# Patient Record
Sex: Male | Born: 1965 | Hispanic: Yes | Marital: Married | Smoking: Never smoker
Health system: Southern US, Community
[De-identification: ages and names within clinical notes are randomized; demographics above are authoritative.]

## PROBLEM LIST (undated history)

## (undated) DIAGNOSIS — R7303 Prediabetes: Secondary | ICD-10-CM

## (undated) DIAGNOSIS — G4733 Obstructive sleep apnea (adult) (pediatric): Secondary | ICD-10-CM

## (undated) DIAGNOSIS — K219 Gastro-esophageal reflux disease without esophagitis: Secondary | ICD-10-CM

## (undated) DIAGNOSIS — E785 Hyperlipidemia, unspecified: Secondary | ICD-10-CM

## (undated) DIAGNOSIS — R011 Cardiac murmur, unspecified: Secondary | ICD-10-CM

## (undated) DIAGNOSIS — M5030 Other cervical disc degeneration, unspecified cervical region: Secondary | ICD-10-CM

## (undated) DIAGNOSIS — I10 Essential (primary) hypertension: Secondary | ICD-10-CM

## (undated) DIAGNOSIS — E559 Vitamin D deficiency, unspecified: Secondary | ICD-10-CM

## (undated) DIAGNOSIS — R519 Headache, unspecified: Secondary | ICD-10-CM

## (undated) DIAGNOSIS — K429 Umbilical hernia without obstruction or gangrene: Secondary | ICD-10-CM

## (undated) DIAGNOSIS — F419 Anxiety disorder, unspecified: Secondary | ICD-10-CM

## (undated) DIAGNOSIS — Z9989 Dependence on other enabling machines and devices: Secondary | ICD-10-CM

## (undated) HISTORY — DX: Prediabetes: R73.03

## (undated) HISTORY — DX: Essential (primary) hypertension: I10

## (undated) HISTORY — DX: Vitamin D deficiency, unspecified: E55.9

## (undated) HISTORY — DX: Dependence on other enabling machines and devices: Z99.89

## (undated) HISTORY — DX: Obstructive sleep apnea (adult) (pediatric): G47.33

## (undated) HISTORY — DX: Hyperlipidemia, unspecified: E78.5

## (undated) HISTORY — PX: NO PAST SURGERIES: SHX2092

---

## 2013-12-28 LAB — COMPREHENSIVE METABOLIC PANEL
ALK PHOS: 51 U/L
ALT: 37
AST: 30 U/L
BILIRUBIN: 1.2
Creat: 1.12
Glucose: 65
LDH: 213 U/L
POTASSIUM: 3.6 mmol/L
Sodium: 140 mmol/L (ref 137–147)

## 2013-12-28 LAB — TSH: TSH: 1.76

## 2013-12-28 LAB — LIPID PANEL
CHOLESTEROL, TOTAL: 236
HDL: 46 mg/dL (ref 35–70)
LDL CALC: 162
Triglycerides: 142

## 2013-12-28 LAB — VITAMIN D 25 HYDROXY (VIT D DEFICIENCY, FRACTURES): Vit D, 25-Hydroxy: 20.6

## 2013-12-28 LAB — PSA: PSA: 0.8

## 2013-12-28 LAB — HEMOGLOBIN A1C: A1c: 6

## 2013-12-28 LAB — CBC
HGB: 16.6 g/dL
WBC: 7.1
platelet count: 14

## 2014-10-12 ENCOUNTER — Ambulatory Visit (INDEPENDENT_AMBULATORY_CARE_PROVIDER_SITE_OTHER): Payer: BLUE CROSS/BLUE SHIELD | Admitting: Family Medicine

## 2014-10-12 ENCOUNTER — Encounter: Payer: Self-pay | Admitting: Family Medicine

## 2014-10-12 VITALS — BP 154/90 | HR 82 | Temp 98.3°F | Ht 71.25 in | Wt 223.2 lb

## 2014-10-12 DIAGNOSIS — R7309 Other abnormal glucose: Secondary | ICD-10-CM

## 2014-10-12 DIAGNOSIS — Z9989 Dependence on other enabling machines and devices: Secondary | ICD-10-CM

## 2014-10-12 DIAGNOSIS — E559 Vitamin D deficiency, unspecified: Secondary | ICD-10-CM

## 2014-10-12 DIAGNOSIS — Z6830 Body mass index (BMI) 30.0-30.9, adult: Secondary | ICD-10-CM | POA: Insufficient documentation

## 2014-10-12 DIAGNOSIS — R030 Elevated blood-pressure reading, without diagnosis of hypertension: Secondary | ICD-10-CM

## 2014-10-12 DIAGNOSIS — IMO0001 Reserved for inherently not codable concepts without codable children: Secondary | ICD-10-CM | POA: Insufficient documentation

## 2014-10-12 DIAGNOSIS — R7303 Prediabetes: Secondary | ICD-10-CM | POA: Insufficient documentation

## 2014-10-12 DIAGNOSIS — I10 Essential (primary) hypertension: Secondary | ICD-10-CM

## 2014-10-12 DIAGNOSIS — G4733 Obstructive sleep apnea (adult) (pediatric): Secondary | ICD-10-CM

## 2014-10-12 DIAGNOSIS — E785 Hyperlipidemia, unspecified: Secondary | ICD-10-CM

## 2014-10-12 DIAGNOSIS — E669 Obesity, unspecified: Secondary | ICD-10-CM

## 2014-10-12 DIAGNOSIS — E663 Overweight: Secondary | ICD-10-CM | POA: Insufficient documentation

## 2014-10-12 NOTE — Assessment & Plan Note (Signed)
Continue 2000 IU daily.  

## 2014-10-12 NOTE — Assessment & Plan Note (Signed)
Elevated previously - provided with low chol diet handout. Will recheck in next few months.

## 2014-10-12 NOTE — Assessment & Plan Note (Signed)
Reports compliance with CPAP. Thinks has setting of 4.

## 2014-10-12 NOTE — Assessment & Plan Note (Signed)
Planning on returning to apartment gym.

## 2014-10-12 NOTE — Assessment & Plan Note (Signed)
Prior controlled with healthy diet and lifestyle. Will monitor at home and notify me if persistently elevated >140/90

## 2014-10-12 NOTE — Patient Instructions (Addendum)
Return June for CPE. Regresar en junio despues de laboratorios en el trabajo para examen fisico. Si presion consistentemente 140/90, dejeme saber para empezar medicina para presion arterial.  Empieza a visitar gimnasio de nuevo.  Revisa en trabajo la fecha de la ultima vacuna contra tetano y tos ferina (Td o Tdap). Tambien revisa sobre hepatitis B.

## 2014-10-12 NOTE — Assessment & Plan Note (Signed)
Reviewed #s with patient. Encouraged avoiding added sugars. 

## 2014-10-12 NOTE — Progress Notes (Signed)
Pre visit review using our clinic review tool, if applicable. No additional management support is needed unless otherwise documented below in the visit note. 

## 2014-10-12 NOTE — Progress Notes (Signed)
BP 154/90 mmHg  Pulse 82  Temp(Src) 98.3 F (36.8 C) (Oral)  Ht 5' 11.25" (1.81 m)  Wt 223 lb 4 oz (101.266 kg)  BMI 30.91 kg/m2   CC: new pt to establish  Subjective:    Patient ID: Jesse Phillips, male    DOB: [birth year], 49 y.o.   MRN: [id number]  HPI: Jesse Phillips is a 49 y.o. male presenting on 10/12/2014 for Establish Care   Planning 25 yr anniversary trip to [place]. Lives with wife Riki AltesLidia.  HTN - bp elevated at work as well. Changed diet, increased exercise, and bp improved. Now with weight gain and decreased activity level over last year, bp slowly increasing- but at work still well controlled (120/80s). Drinks vegetables smoothie every morning.   Prediabetes - on latest labwork 12/2013.   HLD - on latest labwork. Never on meds in past.   OSA on CPAP - reports compliance with this.  Intermittent groin pain that alternates sides.   Preventative: Prostate cancer screening: + fmhx - recommend yearly Flu shot - declines Tetanus - will check with work  [place]ives with [place] wife and 1 son Occupation: Location managermachine operator Edu: HS Activity: occasionally gym Diet: likes sweets and carbs, wife cooks healthy  Relevant past medical, surgical, family and social history reviewed and updated as indicated. Interim medical history since our last visit reviewed. Allergies and medications reviewed and updated. No current outpatient prescriptions on file prior to visit.   No current facility-administered medications on file prior to visit.    Review of Systems Per HPI unless specifically indicated above     Objective:    BP 154/90 mmHg  Pulse 82  Temp(Src) 98.3 F (36.8 C) (Oral)  Ht 5' 11.25" (1.81 m)  Wt 223 lb 4 oz (101.266 kg)  BMI 30.91 kg/m2  Wt Readings from Last 3 Encounters:  10/12/14 223 lb 4 oz (101.266 kg)    Physical Exam  Constitutional: He appears well-developed and well-nourished. No distress.  HENT:  Mouth/Throat: Oropharynx is clear and moist. No  oropharyngeal exudate.  Eyes: Conjunctivae and EOM are normal. Pupils are equal, round, and reactive to light. No scleral icterus.  Neck: Normal range of motion. Neck supple. No thyromegaly present.  Cardiovascular: Normal rate, regular rhythm, normal heart sounds and intact distal pulses.   No murmur heard. Pulmonary/Chest: Effort normal and breath sounds normal. No respiratory distress. He has no wheezes. He has no rales.  Musculoskeletal: He exhibits no edema.  Lymphadenopathy:    He has no cervical adenopathy.  Skin: Skin is warm and dry. No rash noted.  Psychiatric: He has a normal mood and affect.  Nursing note and vitals reviewed.  No results found for this or any previous visit.    Assessment & Plan:   Problem List Items Addressed This Visit    Vitamin D deficiency    Continue 2000 IU daily.      Prediabetes    Reviewed #s with patient. Encouraged avoiding added sugars.      OSA on CPAP    Reports compliance with CPAP. Thinks has setting of 4.      Obesity    Planning on returning to apartment gym.      HTN (hypertension) - Primary    Prior controlled with healthy diet and lifestyle. Will monitor at home and notify me if persistently elevated >140/90      HLD (hyperlipidemia)    Elevated previously - provided with low chol diet handout. Will recheck in  next few months.           Follow up plan: Return in about 4 months (around 02/10/2015), or as needed, for annual exam, prior fasting for blood work.

## 2014-10-13 ENCOUNTER — Telehealth: Payer: Self-pay | Admitting: Family Medicine

## 2014-10-13 NOTE — Telephone Encounter (Signed)
Opened in error

## 2014-10-25 ENCOUNTER — Encounter: Payer: Self-pay | Admitting: *Deleted

## 2015-01-03 LAB — IRON: IRON: 102

## 2015-01-03 LAB — COMPREHENSIVE METABOLIC PANEL
ALK PHOS: 55 U/L
ALT: 32
AST: 29 U/L
BILIRUBIN TOTAL: 0.8 mg/dL
BUN: 14 mg/dL (ref 4–21)
Creat: 1.08
GLUCOSE: 70
POTASSIUM: 3.9 mmol/L
Sodium: 140
Uric Acid: 6.2

## 2015-01-03 LAB — LIPID PANEL
Cholesterol: 221
HDL: 52 mg/dL (ref 35–70)
LDL CALC: 148
Triglycerides: 107

## 2015-01-03 LAB — CBC
HEMOGLOBIN: 16 g/dL
PLATELETS: 234
WBC: 5.6

## 2015-01-03 LAB — VITAMIN D 25 HYDROXY (VIT D DEFICIENCY, FRACTURES): VIT D 25 HYDROXY: 33.1

## 2015-01-03 LAB — HEMOGLOBIN A1C: A1C: 5.8

## 2015-01-03 LAB — TSH: TSH: 2.52 u[IU]/mL (ref 0.41–5.90)

## 2015-01-03 LAB — PSA: PSA: 0.8

## 2015-02-21 ENCOUNTER — Encounter: Payer: BLUE CROSS/BLUE SHIELD | Admitting: Family Medicine

## 2015-04-11 ENCOUNTER — Encounter: Payer: Self-pay | Admitting: Family Medicine

## 2015-04-11 ENCOUNTER — Encounter (INDEPENDENT_AMBULATORY_CARE_PROVIDER_SITE_OTHER): Payer: Self-pay

## 2015-04-11 ENCOUNTER — Encounter: Payer: Self-pay | Admitting: *Deleted

## 2015-04-11 ENCOUNTER — Ambulatory Visit (INDEPENDENT_AMBULATORY_CARE_PROVIDER_SITE_OTHER): Payer: BLUE CROSS/BLUE SHIELD | Admitting: Family Medicine

## 2015-04-11 VITALS — BP 124/88 | HR 80 | Temp 98.1°F | Ht 71.25 in | Wt 208.0 lb

## 2015-04-11 DIAGNOSIS — Z1211 Encounter for screening for malignant neoplasm of colon: Secondary | ICD-10-CM

## 2015-04-11 DIAGNOSIS — R7303 Prediabetes: Secondary | ICD-10-CM

## 2015-04-11 DIAGNOSIS — E663 Overweight: Secondary | ICD-10-CM

## 2015-04-11 DIAGNOSIS — E785 Hyperlipidemia, unspecified: Secondary | ICD-10-CM

## 2015-04-11 DIAGNOSIS — Z0000 Encounter for general adult medical examination without abnormal findings: Secondary | ICD-10-CM | POA: Diagnosis not present

## 2015-04-11 DIAGNOSIS — R030 Elevated blood-pressure reading, without diagnosis of hypertension: Secondary | ICD-10-CM

## 2015-04-11 DIAGNOSIS — IMO0001 Reserved for inherently not codable concepts without codable children: Secondary | ICD-10-CM

## 2015-04-11 NOTE — Assessment & Plan Note (Signed)
Discussed #s with patient. Encouraged avoiding added sugars, congratulated on healthy diet and lifestyle changes.

## 2015-04-11 NOTE — Assessment & Plan Note (Signed)
Previously - now back to normal with weight loss and healthy changes. Brings log showing normal readings at work. Will remove from problem list.

## 2015-04-11 NOTE — Progress Notes (Signed)
Pre visit review using our clinic review tool, if applicable. No additional management support is needed unless otherwise documented below in the visit note. 

## 2015-04-11 NOTE — Assessment & Plan Note (Addendum)
Weight loss noted. Congratulated

## 2015-04-11 NOTE — Assessment & Plan Note (Signed)
Preventative protocols reviewed and updated unless pt declined. Discussed healthy diet and lifestyle.  

## 2015-04-11 NOTE — Patient Instructions (Addendum)
Pass by lab to pick up stool kit. Examen de heces para revisar cancer de colon - recogalo en el laboratorio hoy. Gusto verlo hoy, llamenos con preguntas. Regresar en 1 ao para proximo examen fisico.  Mantenimiento de Technical sales engineer (Health Maintenance) Un estilo de vida saludable y los cuidados preventivos pueden favorecer la salud y [place].  No deje de [employer] de rutina de la salud, dentales y de Public librarian.  Consuma una dieta saludable. Los [employer], frutas, cereales integrales, productos lcteos descremados y protenas magras contienen los nutrientes que usted necesita y no tienen muchas caloras. Disminuya la ingesta de alimentos ricos en grasas slidas, azcares y sal agregadas. Si es necesario, pdale informacin acerca de una dieta [place] a su mdico.  Realizar actividad fsica de forma regular es una de las prcticas ms importantes que puede hacer por su salud. Los adultos deben hacer al menos 150 minutos de ejercicios de intensidad moderada (cualquier actividad que aumente la frecuencia cardaca y lo haga transpirar) cada semana. Adems, la [employer] de los adultos necesita practicar ejercicios de fortalecimiento muscular dos o ms veces por semana.  Mantenga un peso saludable. El ndice de masa corporal [facility]) es una herramienta que identifica posibles problemas con [place]. Proporciona una estimacin de la grasa corporal basndose en el peso y la altura. El mdico podr determinar su [facility] y ayudarlo a Scientist, forensic o Theatre manager un peso saludable. Para los adultos mayores de 20aos:  Un [facility] menor de 18,5 se considera bajo peso.  Un [facility] entre 18,5 y 24,9 es normal.  Un [facility] entre 25 y 29,9 se considera sobrepeso.  Un IMC de 30 o ms se considera obesidad.  Mantenga un nivel normal de lpidos y colesterol en la sangre practicando actividad fsica y minimizando la ingesta de grasas saturadas. Consuma una dieta balanceada e incluya variedad de frutas y vegetales. A  partir de los 20 aos se deben realizar anlisis de sangre a fin de Freight forwarder nivel de lpidos y colesterol en la sangre y [place] cada 5 aos. Si los niveles de colesterol son altos, tiene ms de 50 aos o tiene riesgo elevado de sufrir enfermedades cardacas, Designer, industrial/product controlarse con ms frecuencia. Si tiene [employer] de lpidos y colesterol, debe recibir tratamiento con medicamentos, si la dieta y el ejercicio no estn funcionando.  Si fuma, consulte con el mdico acerca de las opciones para dejar de [place]. Si no fuma, no comience.  Se recomienda realizar exmenes de deteccin de cncer de pulmn a personas adultas entre 24 y 72 aos que estn en riesgo de Horticulturist, commercial de pulmn por sus antecedentes de consumo de tabaco. Para quienes hayan fumado durante 30 aos un paquete diario, y sigan fumando o hayan dejado el hbito en algn momento en los ltimos 15 aos, se recomienda realizarse una tomografa computada de baja dosis de los pulmones todos los [place]. Fumar un paquete por ao equivale a fumar un promedio de un paquete de cigarrillos diario durante un ao (por ejemplo, fumar 30paquetes por ao podra significar fumar un paquete de cigarrillos diario durante 30aos o 2paquetes diarios durante 15aos). Los exmenes anuales deben continuar hasta que el fumador haya dejado de fumar durante un mnimo de 15 aos. Ya no deben Emergency planning/management officer que tengan un problema de salud que les impida recibir tratamiento para el cncer de pulmn.  Si decide tomar alcohol, no beba ms de [employer]. Se considera una medida 12onzas (326m) de cerveza, 5onzas (  140m) de vino o [id number](497LG de licor.  Evite el consumo de drogas. No comparta agujas. Pida ayuda si necesita asistencia o instrucciones con respecto a abandonar el consumo de drogas.  La hipertensin arterial causa enfermedades cardacas y aSerbiael riesgo de ictus. Debe controlar su presin arterial al menos  cada uno o d[place] La presin arterial elevada que persiste debe tratarse con medicamentos si la prdida de peso y el ejercicio no son efectivos.  Si tiene entre 456y 743aos, consulte a su mdico si debe tomar aspirina para prevenir enfermedades cardacas.  Los anlisis para la diabetes incluyen la toma de uTanzaniade sangre para controlar el nivel de azcar en la sangre durante el [place] Debe hacerlos cada 3aos despus de los 441aossi su peso es normal y no tiene factores de riesgo de diabetes. Las pruebas deben comenzar a edades tempranas o llevarse a cabo con ms frecuencia si tiene sobrepeso y al menos un factor de riesgo para la diabetes.  El cncer colorrectal puede detectarse y con frecuencia puede prevenirse. La mayor parte de los estudios de rutina se deben cMedical laboratory scientific officera hField seismologista pProofreaderde los 566aos y hBrown Station781aos. Sin embargo, el mdico podr aconsejarle que lo haga antes, si tiene factores de riesgo para el cncer de colon. Una vez por ao, el mdico le dar un kit de prueba para hHydrologisten la materia fecal. Es posible que se use una pequea cmara en el extremo de un tubo para examinar directamente el colon (sigmoidoscopa o colonoscopa) para dHydrographic surveyorformas tempranas de cncer colorrectal. Hable sobre esto con su mdico si tiene 570aos, edad a la que cWhole Foodsestudios de rNepal El examen directo del colon debe repetirse cada cinco a 10 aos, hasta los 75 aos, excepto que se encuentren formas tempranas de plipos precancerosos o pequeos bultos.  Las personas con un riesgo mayor de pInsurance risk surveyorhepatitis B deben realizarse anlisis para dFutures tradervirus. Se considera que tiene un alto riesgo de cMuseum/gallery curatorhepatitis B si:  Naci en un pas donde la hepatitis B es frecuente. Pregntele a su mdico qu pases son considerados de aPublic affairs consultant  Sus padres nacieron en un pas de alto riesgo y usted no recibi una vacuna que lo proteja contra la hepatitis B (vacuna  contra la hepatitis B).  TSouth [place]  UCanadaagujas para inyectarse drogas.  Vive o tiene sexo con alguien que tiene hepatitis B.  Es un hombre que tiene sexo con otros hombres.  Recibe tratamiento de hemodilisis.  Toma ciertos medicamentos para eNurse, mental health trasplante de rganos y afecciones autoinmunes.  Se recomienda realizar un anlisis de sangre para dHydrographic surveyorhepatitis C a todas las personas nacidas entre 1945 y 1965, y a toda persona que tenga un riesgo de haber contrado esta enfermedad.  Los hombres sanos no deben hacerse anlisis de sangre para dHydrographic surveyorantgenos especficos prostticos (PSA) como parte de los estudios de rutina para eScience writer Pregntele a su mdico sobre las pruebas de deteccin de cncer de prstata.  La evaluacin del cncer de testculos no se recomienda en hombres adolescentes ni adultos que no tengan sntomas. La evaluacin incluye el autoexamen, el examen por parte del mdico y otras pruebas diagnsticas. Consulte con su mdico si tiene algn sntoma o preocupaciones acerca del cncer de testculos.  Practique el sexo seguro. Use condones y evite las prcticas sexuales riesgosas para disminuir el contagio de enfermedades de transmisin sexual (  ETS).  Debe realizarse pruebas de deteccin de ETS, incluidas la gonorrea y la clamidia si:  Es sexualmente activa y es menor de 24aos.  Es mayor de 13aos y Investment banker, operational dice que est en riesgo de padecer esta infeccin.  La actividad sexual ha cambiado desde que le hicieron la ltima prueba de deteccin y tiene un riesgo mayor de Best boy clamidia o Radio broadcast assistant. Pregntele al mdico si usted tiene riesgo.  Si tiene riesgo de infectarse por el VIH, se recomienda tomar diariamente un medicamento recetado para evitar la infeccin. Esto se conoce como profilaxis previa a la exposicin. Se considera que est en riesgo si:  Es un hombre que tiene sexo con otros hombres.  Es heterosexual y es activo  sexualmente con mltiples parejas.  Se inyecta drogas.  Es sexualmente activo con una pareja que tiene VIH.  Consulte a su mdico para saber si tiene un alto riesgo de infectarse por el VIH. Si opta por comenzar la profilaxis previa a la exposicin, primero debe realizarse anlisis de deteccin del VIH. Luego, le harn anlisis cada 64mses mientras est tomando los medicamentos para la profilaxis previa a la exposicin.  Utilice pantalla solar. Aplique pantalla solar de mKerry Doryy repetida a lo largo del dTraining and development officer Resgurdese del sol cuando la sombra sea ms pequea que usted. Protjase usando mangas y pThe ServiceMaster Company un sombrero de ala ancha y gafas para el sol todo el ao, siempre que se encuentre en el exterior.  Informe a su mdico si aparecen nuevos lunares o los que tiene se modifican, especialmente en forma y color. Tambin notifique al mdico si un lunar es ms grande que el tamao de una goma de lGames developer  Si tiene entre 622y 738aos y es o ha sido fumador, se recomienda un estudio con ecografa para dEnvironmental managerde aorta abdominal (AAA) y su eventual reparacin qUnited [place]  MReasnor(inmunizaciones). Document Released: 02/07/2008 Document Revised: 08/16/2013 ELutheran Hospital Of IndianaPatient Information 2015 ELas Ochen[place] This information is not intended to replace advice given to you by your health care provider. Make sure you discuss any questions you have with your health care provider.

## 2015-04-11 NOTE — Progress Notes (Signed)
BP 124/88 mmHg  Pulse 80  Temp(Src) 98.1 F (36.7 C) (Oral)  Ht 5' 11.25" (1.81 m)  Wt 208 lb (94.348 kg)  BMI 28.80 kg/m2   CC: CPE  Subjective:    Patient ID: Jesse Phillips, male    DOB: Jan 18, 1966, 49 y.o.   MRN: [id number]  HPI: Zarin Knupp is a 49 y.o. male presenting on 04/11/2015 for Annual Exam   Feels sad about brother's death but not overwhelming.  OSA on CPAP   Brings log of blood pressures all <140/90.   Brings blood work - TC and LDL (148) elevated otherwise CMP, CBC, vit D normal. A1c 5.8% - prediabetes   Lost 15lbs! Watching diet, exercising at gym. Drinks juice (celery, garlic, spinach, ginger).   Preventative:  Colon cancer screening -  Prostate cancer screening: + fmhx - recommend yearly  Flu shot - declines  Tdap 10/2014   Lives with [place] wife and 1 son  Occupation: Location manager  From [place]  Edu: HS  Activity: occasionally gym  Diet: likes but avoids sweets and carbs, wife cooks healthy   Relevant past medical, surgical, family and social history reviewed and updated as indicated. Interim medical history since our last visit reviewed. Allergies and medications reviewed and updated. Current Outpatient Prescriptions on File Prior to Visit  Medication Sig  . Cholecalciferol (VITAMIN D) 2000 UNITS CAPS Take 1 capsule by mouth daily.  . Multiple Vitamins-Minerals (CENTRUM ADULTS PO) Take 1 tablet by mouth daily.   No current facility-administered medications on file prior to visit.    Review of Systems  Constitutional: Negative for fever, chills, activity change, appetite change, fatigue and unexpected weight change.  HENT: Negative for hearing loss.   Eyes: Negative for visual disturbance.  Respiratory: Negative for cough, chest tightness, shortness of breath and wheezing.   Cardiovascular: Negative for chest pain, palpitations and leg swelling.  Gastrointestinal: Negative for nausea, vomiting, abdominal pain, diarrhea, constipation,  blood in stool and abdominal distention.  Genitourinary: Negative for hematuria and difficulty urinating.  Musculoskeletal: Negative for myalgias, arthralgias and neck pain.  Skin: Negative for rash.  Neurological: Negative for dizziness, seizures, syncope and headaches.  Hematological: Negative for adenopathy. Does not bruise/bleed easily.  Psychiatric/Behavioral: Negative for dysphoric mood. The patient is not nervous/anxious.    Per HPI unless specifically indicated above     Objective:    BP 124/88 mmHg  Pulse 80  Temp(Src) 98.1 F (36.7 C) (Oral)  Ht 5' 11.25" (1.81 m)  Wt 208 lb (94.348 kg)  BMI 28.80 kg/m2  Wt Readings from Last 3 Encounters:  04/11/15 208 lb (94.348 kg)  10/12/14 223 lb 4 oz (101.266 kg)    Physical Exam  Constitutional: He is oriented to person, place, and time. He appears well-developed and well-nourished. No distress.  HENT:  Head: Normocephalic and atraumatic.  Right Ear: Hearing, tympanic membrane, external ear and ear canal normal.  Left Ear: Hearing, tympanic membrane, external ear and ear canal normal.  Nose: Nose normal.  Mouth/Throat: Uvula is midline, oropharynx is clear and moist and mucous membranes are normal. No oropharyngeal exudate, posterior oropharyngeal edema or posterior oropharyngeal erythema.  Eyes: Conjunctivae and EOM are normal. Pupils are equal, round, and reactive to light. No scleral icterus.  Neck: Normal range of motion. Neck supple. No thyromegaly present.  Cardiovascular: Normal rate, regular rhythm, normal heart sounds and intact distal pulses.   No murmur heard. Pulses:      Radial pulses are 2+ on the right  side, and 2+ on the left side.  Pulmonary/Chest: Effort normal and breath sounds normal. No respiratory distress. He has no wheezes. He has no rales.  Abdominal: Soft. Bowel sounds are normal. He exhibits no distension and no mass. There is no tenderness. There is no rebound and no guarding.  Genitourinary: Rectum  normal and prostate normal. Rectal exam shows no external hemorrhoid, no internal hemorrhoid, no fissure, no mass, no tenderness and anal tone normal. Prostate is not enlarged (15gm) and not tender.  Musculoskeletal: Normal range of motion. He exhibits no edema.  Lymphadenopathy:    He has no cervical adenopathy.  Neurological: He is alert and oriented to person, place, and time.  CN grossly intact, station and gait intact  Skin: Skin is warm and dry. No rash noted.  Psychiatric: He has a normal mood and affect. His behavior is normal. Judgment and thought content normal.  Nursing note and vitals reviewed.  Results for orders placed or performed in visit on 10/25/14  Comprehensive metabolic panel  Result Value Ref Range   Glucose 65    Creat 1.12    Sodium 140 137 - 147 mmol/L   Potassium 3.6 mmol/L   Bilirubin 1.2    Alkaline Phosphatase 51 U/L   LDH 213 u/L   AST 30 U/L   ALT 37   Lipid panel  Result Value Ref Range   Cholesterol, Total 236    Triglycerides 142    HDL 46 35 - 70 mg/dL   LDL (calc) 161   TSH  Result Value Ref Range   TSH 1.760   PSA  Result Value Ref Range   PSA 0.8   CBC  Result Value Ref Range   WBC 7.1    HGB 16.6 g/dL   platelet count 09.6   Hemoglobin A1c  Result Value Ref Range   A1c 6.0   Vit D  25 hydroxy (rtn osteoporosis monitoring)  Result Value Ref Range   Vit D, 25-Hydroxy 20.6       Assessment & Plan:   Problem List Items Addressed This Visit    HLD (hyperlipidemia)    Off meds. Slowly improving. Pt making healthy diet and lifestyle changes      Overweight    Weight loss noted. Congratulated      Prediabetes    Discussed #s with patient. Encouraged avoiding added sugars, congratulated on healthy diet and lifestyle changes.      Health maintenance examination - Primary    Preventative protocols reviewed and updated unless pt declined. Discussed healthy diet and lifestyle.       RESOLVED: Elevated blood pressure     Previously - now back to normal with weight loss and healthy changes. Brings log showing normal readings at work. Will remove from problem list.          Follow up plan: Return in about 1 year (around 04/10/2016), or as needed, for annual exam, prior fasting for blood work.

## 2015-04-11 NOTE — Assessment & Plan Note (Signed)
Off meds. Slowly improving. Pt making healthy diet and lifestyle changes

## 2015-04-18 LAB — FECAL OCCULT BLOOD, GUAIAC: FECAL OCCULT BLD: NEGATIVE

## 2015-04-19 ENCOUNTER — Encounter: Payer: Self-pay | Admitting: Family Medicine

## 2015-04-19 ENCOUNTER — Other Ambulatory Visit: Payer: BLUE CROSS/BLUE SHIELD

## 2015-04-19 DIAGNOSIS — Z1211 Encounter for screening for malignant neoplasm of colon: Secondary | ICD-10-CM

## 2015-04-19 LAB — FECAL OCCULT BLOOD, IMMUNOCHEMICAL: Fecal Occult Bld: NEGATIVE

## 2015-04-20 ENCOUNTER — Encounter: Payer: Self-pay | Admitting: *Deleted

## 2015-11-26 DIAGNOSIS — G4733 Obstructive sleep apnea (adult) (pediatric): Secondary | ICD-10-CM | POA: Diagnosis not present

## 2015-11-26 DIAGNOSIS — J301 Allergic rhinitis due to pollen: Secondary | ICD-10-CM | POA: Diagnosis not present

## 2016-01-18 DIAGNOSIS — G4733 Obstructive sleep apnea (adult) (pediatric): Secondary | ICD-10-CM | POA: Diagnosis not present

## 2016-04-21 DIAGNOSIS — G4733 Obstructive sleep apnea (adult) (pediatric): Secondary | ICD-10-CM | POA: Diagnosis not present

## 2016-05-29 DIAGNOSIS — I10 Essential (primary) hypertension: Secondary | ICD-10-CM | POA: Diagnosis not present

## 2016-05-29 DIAGNOSIS — G4733 Obstructive sleep apnea (adult) (pediatric): Secondary | ICD-10-CM | POA: Diagnosis not present

## 2016-07-23 DIAGNOSIS — G4733 Obstructive sleep apnea (adult) (pediatric): Secondary | ICD-10-CM | POA: Diagnosis not present

## 2016-12-24 DIAGNOSIS — G4733 Obstructive sleep apnea (adult) (pediatric): Secondary | ICD-10-CM | POA: Diagnosis not present

## 2017-03-24 DIAGNOSIS — G4733 Obstructive sleep apnea (adult) (pediatric): Secondary | ICD-10-CM | POA: Diagnosis not present

## 2017-06-04 DIAGNOSIS — G4733 Obstructive sleep apnea (adult) (pediatric): Secondary | ICD-10-CM | POA: Diagnosis not present

## 2017-06-04 DIAGNOSIS — J301 Allergic rhinitis due to pollen: Secondary | ICD-10-CM | POA: Diagnosis not present

## 2017-06-24 DIAGNOSIS — G4733 Obstructive sleep apnea (adult) (pediatric): Secondary | ICD-10-CM | POA: Diagnosis not present

## 2017-07-09 LAB — HEPATIC FUNCTION PANEL
ALT: 46 — AB (ref 10–40)
AST: 36 (ref 14–40)
Alkaline Phosphatase: 54 (ref 25–125)
Bilirubin, Total: 0.9

## 2017-07-09 LAB — BASIC METABOLIC PANEL
CREATININE: 1 (ref 0.6–1.3)
GLUCOSE: 77
POTASSIUM: 5 (ref 3.4–5.3)
SODIUM: 138 (ref 137–147)

## 2017-07-09 LAB — LIPID PANEL
Cholesterol: 256 — AB (ref 0–200)
LDL Cholesterol: 174
TRIGLYCERIDES: 167 — AB (ref 40–160)

## 2017-07-09 LAB — HEMOGLOBIN A1C: HEMOGLOBIN A1C: 5.9

## 2017-07-09 LAB — PSA: PSA: 0.9

## 2017-07-09 LAB — CBC AND DIFFERENTIAL
Hemoglobin: 16.8 (ref 13.5–17.5)
WBC: 6.2

## 2017-07-09 LAB — TSH: TSH: 1.77 (ref 0.41–5.90)

## 2017-07-30 ENCOUNTER — Encounter: Payer: Self-pay | Admitting: Family Medicine

## 2017-07-30 ENCOUNTER — Encounter (INDEPENDENT_AMBULATORY_CARE_PROVIDER_SITE_OTHER): Payer: Self-pay

## 2017-07-30 ENCOUNTER — Ambulatory Visit (INDEPENDENT_AMBULATORY_CARE_PROVIDER_SITE_OTHER): Payer: BLUE CROSS/BLUE SHIELD | Admitting: Family Medicine

## 2017-07-30 VITALS — BP 152/90 | HR 85 | Temp 98.6°F | Ht 70.5 in | Wt 216.0 lb

## 2017-07-30 DIAGNOSIS — R011 Cardiac murmur, unspecified: Secondary | ICD-10-CM

## 2017-07-30 DIAGNOSIS — R7303 Prediabetes: Secondary | ICD-10-CM

## 2017-07-30 DIAGNOSIS — E669 Obesity, unspecified: Secondary | ICD-10-CM

## 2017-07-30 DIAGNOSIS — Z0000 Encounter for general adult medical examination without abnormal findings: Secondary | ICD-10-CM

## 2017-07-30 DIAGNOSIS — E785 Hyperlipidemia, unspecified: Secondary | ICD-10-CM | POA: Diagnosis not present

## 2017-07-30 DIAGNOSIS — R030 Elevated blood-pressure reading, without diagnosis of hypertension: Secondary | ICD-10-CM | POA: Diagnosis not present

## 2017-07-30 DIAGNOSIS — I10 Essential (primary) hypertension: Secondary | ICD-10-CM | POA: Insufficient documentation

## 2017-07-30 DIAGNOSIS — E559 Vitamin D deficiency, unspecified: Secondary | ICD-10-CM | POA: Diagnosis not present

## 2017-07-30 NOTE — Assessment & Plan Note (Signed)
Mild both at apex and sternal border. ?mild MVP. Pt asxs. Will continue to monitor.

## 2017-07-30 NOTE — Patient Instructions (Addendum)
Siga revisando presion arterial en casa o trabajo y me deja saber si regularmente >140/90.  Pase por el laboratorio para recoger examen de heces (iFOB).  Regresar en marzo para revisar presion arterial de nuevo.  Mas fruta, verdura para comida alta en potassio. Menos sal (limite sodio a <1.5 gramos/dia) Mas ejercicio durante el dia - perdida de peso ayudara bajar la presion.  Mantenimiento de Engineer, civil (consulting) hombres (Health Maintenance, Male) Un estilo de vida saludable y los cuidados preventivos son importantes para la salud y Counsellor. Pregntele al mdico cul es el cronograma de exmenes peridicos adecuado para usted. QU DEBO SABER SOBRE EL PESO Y LA DIETA? Consuma una dieta saludable  Coma muchas verduras, frutas, cereales integrales, productos lcteos con bajo contenido de grasa y Associate Professor.  No consuma muchos alimentos de alto contenido de grasas slidas, azcares agregados o sal. Mantenga un peso saludable La actividad fsica habitual puede ayudarlo a Barista o mantener un peso saludable. Deber hacer lo siguiente:  Realizar al menos de actividad fsica por semana. El ejercicio debe aumentar la frecuencia cardaca y Development worker, international aid la transpiracin (ejercicio de Equality).  Hacer ejercicios de entrenamiento de fuerza por lo [employer] por semana. Controlarse los niveles de colesterol y lpidos en la sangre  Hgase anlisis de sangre para controlar los lpidos y el colesterol cada 5aos a partir de los 35aos. Si tiene un riesgo alto de Warehouse manager cardiopatas coronarias, debe comenzar a [employer] de [place] a los [place]. Es posible que Insurance underwriter los niveles de colesterol con mayor frecuencia si: ? Sus niveles de lpidos y colesterol son altos. ? Es mayor de 16XWR. ? Tiene un riesgo alto de tener cardiopatas coronarias. QU DEBO SABER SOBRE LAS PRUEBAS DE DETECCIN DEL CNCER? Muchos tipos de cncer se pueden detectar de manera  temprana y a menudo prevenirse. Cncer de pulmn  Debe someterse a pruebas de deteccin de cncer de pulmn todos los aos en los siguientes casos: ? Si fuma actualmente y lo ha hecho durante por lo menos 30aos. ? Si fue fumador que dej el hbito en el trmino de los ltimos 15aos.  Hable con el mdico sobre las opciones en relacin con los estudios de deteccin, cundo debe comenzar a Actuary y con Engineer, structural. Cncer colorrectal  Generalmente, las pruebas de deteccin habituales del cncer colorrectal comienzan a los 50aos y deben repetirse cada 5 a 10aos hasta los 75aos. Es posible que tenga que hacerse las pruebas con mayor frecuencia si se detectan formas tempranas de plipos precancerosos o pequeos bultos. Sin embargo, el mdico podr aconsejarle que lo haga antes, si tiene factores de riesgo para el cncer de colon.  El mdico puede recomendarle que use kits de prueba caseros para Recruitment consultant oculta en la materia fecal.  Se puede usar una pequea cmara en el extremo de un tubo para examinar el colon (sigmoidoscopia o colonoscopia). Este estudio [employer] formas ms tempranas de Building services engineer. Cncer de prstata y de testculo  En funcin de la edad y del [place] de salud general, el mdico puede realizarle determinados estudios de deteccin del cncer de prstata y de testculo.  Hable con el mdico sobre cualquier sntoma o acerca de las inquietudes que tenga sobre el cncer de prstata o de testculo. Cncer de piel  Revise la piel de la cabeza a los pies con regularidad.  Informe al mdico si aparecen nuevos lunares o si nota cambios en los que ya tiene, especialmente en  estos casos: ? Si hay un cambio en el tamao, la forma o el color del lunar. ? Si tiene un lunar que es ms grande que el tamao de una goma de Paramediclpiz.  Siempre use pantalla solar. Aplquese pantalla solar de Barth Kirksmanera generosa y repetida a lo largo del Futures traderda.  Use mangas y [employer] Grouppantalones  largos, un sombrero de ala ancha y gafas para el sol cuando est al Guadalupe Dawnaire libre, para protegerse. QU DEBO SABER SOBRE LAS CARDIOPATAS CORONARIAS, LA DIABETES Y LA HIPERTENSIN ARTERIAL?  Si usted tiene entre 18 y 39aos, debe medirse la presin arterial cada 3a 5aos. Si usted tiene 40aos o ms, debe medirse la presin arterial Allied Waste Industriestodos los aos. Debe medirse la presin arterial dos veces: una vez cuando est en un hospital o una clnica y la otra vez cuando est en otro sitio. Registre el promedio de Johnson Controlslas dos mediciones. Para controlar su presin arterial cuando no est en un hospital o Paulita Cradleuna clnica, puede usar lo siguiente: ? Valere DrossUna mquina automtica para medir la presin arterial en una farmacia. ? Un monitor para medir la presin arterial en el hogar.  Hable con el mdico Lowe's Companiessobre los valores ideales de la presin arterial.  Si tiene entre 45 y 79aos, consltele al mdico si debe tomar aspirina para evitar las cardiopatas coronarias.  Hgase anlisis habituales de deteccin de la diabetes; para ello, contrlese la glucemia en ayunas. ? Si su peso es normal y tiene un bajo riesgo de padecer diabetes, realcese este anlisis cada tres aos despus de los 45aos. ? Si tiene sobrepeso y un alto riesgo de padecer diabetes, considere someterse a este anlisis antes o con mayor frecuencia.  Para los hombres que tienen entre 65 y 1375aos, y son o han sido fumadores, se recomienda un nico estudio con ecografa para Engineer, manufacturingdetectar un aneurisma de aorta abdominal (AAA). QU DEBO SABER SOBRE LA PREVENCIN DE LAS INFECCIONES? HepatitisB Si tiene un riesgo ms alto de Primary school teachercontraer hepatitis B, debe someterse a un examen de deteccin de este virus. Hable con el mdico para determinar si corre riesgo de tener una infeccin por hepatitisB. Hepatitis C Se recomienda un anlisis de [place] para:  Todos los que nacieron entre 1946 y [id number].  Todas las personas que tengan un riesgo de haber contrado hepatitis  C. Enfermedades de transmisin sexual (ETS)  Debe realizarse pruebas de Airline pilotdeteccin de las ETS todos los aos, incluidas la gonorrea y la clamidia, en estos casos: ? Es sexualmente activo y es menor de [place]24aos. ? Es mayor de 24aos, y Public affairs consultantel mdico le informa que corre riesgo de tener este tipo de infecciones. ? La actividad sexual ha cambiado desde que le hicieron la ltima prueba de deteccin y tiene un riesgo mayor de Warehouse managertener clamidia o Copygonorrea. Pregntele al mdico si usted tiene riesgo.  Consulte a su mdico para saber si tiene un alto riesgo de infectarse por el VIH. El mdico puede recomendarle un medicamento de venta con receta para ayudar a evitar la infeccin por el VIH. QU MS PUEDO HACER?  Realcese los estudios de rutina de la salud, dentales y de Wellsite geologistla vista.  Mantngase al da con las vacunas (inmunizaciones).  No consuma ningn producto que contenga tabaco, lo que incluye cigarrillos, tabaco de Theatre managermascar y Administrator, Civil Servicecigarrillos electrnicos. Si necesita ayuda para dejar de fumar, consulte al mdico.  Limite el consumo de alcohol a no ms de 2medidas por da. BorgWarnerUna medida equivale a 12 onzas de cerveza, 5onzas de vino o 1onzas de bebidas alcohlicas de  alta graduacin.  No consuma drogas.  No comparta agujas.  Solicite ayuda a su mdico si necesita apoyo o informacin para abandonar las drogas.  Informe a su mdico si a menudo se siente deprimido.  Notifique a su mdico si alguna vez ha sido vctima de abuso o si no se siente seguro en su hogar. Esta informacin no tiene Theme park managercomo fin reemplazar el consejo del mdico. Asegrese de hacerle al mdico cualquier pregunta que tenga. Document Released: 02/07/2008 Document Revised: 09/01/2014 Document Reviewed: 05/15/2015 Elsevier Interactive Patient Education  [employer]2018 Elsevier Inc.

## 2017-07-30 NOTE — Assessment & Plan Note (Addendum)
Discussed with patient. Borderline elevated last several years. RTC 2-3 mo recheck BP, consider medication.

## 2017-07-30 NOTE — Assessment & Plan Note (Signed)
Reviewed importance of weight loss to help manage other medical conditions. Reviewed healthy diet and lifestyle changes to affect sustainable weight loss.

## 2017-07-30 NOTE — Assessment & Plan Note (Signed)
Preventative protocols reviewed and updated unless pt declined. Discussed healthy diet and lifestyle.  

## 2017-07-30 NOTE — Assessment & Plan Note (Signed)
Vit D 33 (12/2016) from labs from work.

## 2017-07-30 NOTE — Assessment & Plan Note (Signed)
Encouraged avoiding added sugars in diet, recommended low carb low sugar diet.

## 2017-07-30 NOTE — Progress Notes (Signed)
BP (!) 152/90 (BP Location: Right Arm, Cuff Size: Normal)   Pulse 85   Temp 98.6 F (37 C) (Oral)   Ht 5' 10.5" (1.791 m)   Wt 216 lb (98 kg)   SpO2 98%   BMI 30.55 kg/m   bp on recheck 166/98  CC: CPE Subjective:    Patient ID: Jesse Phillips, male    DOB: [birth year], 51 y.o.   MRN: [id number]  HPI: Jesse Phillips is a 51 y.o. male presenting on 07/30/2017 for Annual Exam   Brings log of blood pressures at home - 118-140/80s. Overall good readings during the day.  Brings labwork from 12/2016 and 06/2017 which was reviewed with patient. Will update chart.   Preventative:  Colon cancer screening - discussed, would like iFOB Prostate cancer screening: brother passed away from prostate cancer age 52. We are screening yearly.  Flu shot yearly Tdap 10/2014  Seat belt use discussed Sunscreen use discussed. No changing mole on skin Non smoker Alcohol - rare   Lives with [place] wife and 1 son  Occupation: Location managermachine operator  [place]  Edu: HS  Activity: occasionally gym, walking  Diet: wife cooks healthy, good water  Relevant past medical, surgical, family and social history reviewed and updated as indicated. Interim medical history since our last visit reviewed. Allergies and medications reviewed and updated. Outpatient Medications Prior to Visit  Medication Sig Dispense Refill  . Cholecalciferol (VITAMIN D) 2000 UNITS CAPS Take 1 capsule by mouth daily.    . Multiple Vitamins-Minerals (CENTRUM ADULTS PO) Take 1 tablet by mouth daily.     No facility-administered medications prior to visit.      Per HPI unless specifically indicated in ROS section below Review of Systems  Constitutional: Negative for activity change, appetite change, chills, fatigue, fever and unexpected weight change.  HENT: Negative for hearing loss.   Eyes: Negative for visual disturbance.  Respiratory: Negative for cough, chest tightness, shortness of breath and wheezing.   Cardiovascular: Negative  for chest pain, palpitations and leg swelling.  Gastrointestinal: Positive for abdominal pain (with spicy foods). Negative for abdominal distention, blood in stool, constipation, diarrhea, nausea and vomiting.  Genitourinary: Negative for difficulty urinating and hematuria.  Musculoskeletal: Negative for arthralgias, myalgias and neck pain.  Skin: Negative for rash.  Neurological: Negative for dizziness, seizures, syncope and headaches.  Hematological: Negative for adenopathy. Does not bruise/bleed easily.  Psychiatric/Behavioral: Negative for dysphoric mood. The patient is not nervous/anxious.        Objective:    BP (!) 152/90 (BP Location: Right Arm, Cuff Size: Normal)   Pulse 85   Temp 98.6 F (37 C) (Oral)   Ht 5' 10.5" (1.791 m)   Wt 216 lb (98 kg)   SpO2 98%   BMI 30.55 kg/m   Wt Readings from Last 3 Encounters:  07/30/17 216 lb (98 kg)  04/11/15 208 lb (94.3 kg)  10/12/14 223 lb 4 oz (101.3 kg)    Physical Exam  Constitutional: He is oriented to person, place, and time. He appears well-developed and well-nourished. No distress.  HENT:  Head: Normocephalic and atraumatic.  Right Ear: Hearing, tympanic membrane, external ear and ear canal normal.  Left Ear: Hearing, tympanic membrane, external ear and ear canal normal.  Nose: Nose normal.  Mouth/Throat: Uvula is midline, oropharynx is clear and moist and mucous membranes are normal. No oropharyngeal exudate, posterior oropharyngeal edema or posterior oropharyngeal erythema.  Eyes: Conjunctivae and EOM are normal. Pupils are equal, round, and  reactive to light. No scleral icterus.  Neck: Normal range of motion. Neck supple. No thyromegaly present.  Cardiovascular: Normal rate, regular rhythm and intact distal pulses.  Murmur (2/6 at sternal border and apex) heard. Pulses:      Radial pulses are 2+ on the right side, and 2+ on the left side.  Pulmonary/Chest: Effort normal and breath sounds normal. No respiratory  distress. He has no wheezes. He has no rales.  Abdominal: Soft. Normal appearance and bowel sounds are normal. He exhibits no distension and no mass. There is no hepatosplenomegaly. There is no tenderness. There is no rigidity, no rebound, no guarding, no CVA tenderness and negative Murphy's sign. A hernia (umbilical, reducible) is present.  Genitourinary: Rectum normal and prostate normal. Rectal exam shows no external hemorrhoid, no fissure, no mass, no tenderness and anal tone normal. Prostate is not enlarged (15g) and not tender.  Musculoskeletal: Normal range of motion. He exhibits no edema.  Lymphadenopathy:    He has no cervical adenopathy.  Neurological: He is alert and oriented to person, place, and time.  CN grossly intact, station and gait intact  Skin: Skin is warm and dry. No rash noted.  Psychiatric: He has a normal mood and affect. His behavior is normal. Judgment and thought content normal.  Nursing note and vitals reviewed.  Results for orders placed or performed in visit on 04/19/15  Fecal Occult Blood, Guaiac  Result Value Ref Range   Fecal Occult Blood Negative       Assessment & Plan:   Problem List Items Addressed This Visit    Cardiac murmur    Mild both at apex and sternal border. ?mild MVP. Pt asxs. Will continue to monitor.       Elevated blood pressure reading without diagnosis of hypertension    Discussed with patient. Borderline elevated last several years. RTC 2-3 mo recheck BP, consider medication.       Health maintenance examination - Primary    Preventative protocols reviewed and updated unless pt declined. Discussed healthy diet and lifestyle.       HLD (hyperlipidemia)    Chronic, off meds.  ALT slightly elevated this year - ?fatty liver. Reviewed with patient.  The 10-year ASCVD risk score Denman George(Goff DC Montez HagemanJr., et al., 2013) is: 6.2%*   Values used to calculate the score:     Age: [age] years     Sex: Male     Is Non-Hispanic African American: No      Diabetic: No     Tobacco smoker: No     Systolic Blood Pressure: 152 mmHg     Is BP treated: No     HDL Cholesterol: 52 mg/dL     Total Cholesterol: 221 mg/dL*     * - Cholesterol units were assumed for this score calculation       Obesity, Class I, BMI 30.0-34.9 (see actual BMI)    Reviewed importance of weight loss to help manage other medical conditions. Reviewed healthy diet and lifestyle changes to affect sustainable weight loss.       Prediabetes    Encouraged avoiding added sugars in diet, recommended low carb low sugar diet.       Vitamin D deficiency    Vit D 33 (12/2016) from labs from work.           Follow up plan: Return in about 3 months (around 10/28/2017) for follow up visit.  Eustaquio BoydenJavier Cable Fearn, MD

## 2017-07-30 NOTE — Assessment & Plan Note (Signed)
Chronic, off meds.  ALT slightly elevated this year - ?fatty liver. Reviewed with patient.  The 10-year ASCVD risk score Denman George(Goff DC Montez HagemanJr., et al., 2013) is: 6.2%*   Values used to calculate the score:     Age: [age] years     Sex: Male     Is Non-Hispanic African American: No     Diabetic: No     Tobacco smoker: No     Systolic Blood Pressure: 152 mmHg     Is BP treated: No     HDL Cholesterol: 52 mg/dL     Total Cholesterol: 221 mg/dL*     * - Cholesterol units were assumed for this score calculation

## 2017-08-20 ENCOUNTER — Encounter: Payer: Self-pay | Admitting: Family Medicine

## 2017-08-20 ENCOUNTER — Other Ambulatory Visit (INDEPENDENT_AMBULATORY_CARE_PROVIDER_SITE_OTHER): Payer: BLUE CROSS/BLUE SHIELD

## 2017-08-20 ENCOUNTER — Other Ambulatory Visit: Payer: Self-pay | Admitting: Family Medicine

## 2017-08-20 DIAGNOSIS — Z1211 Encounter for screening for malignant neoplasm of colon: Secondary | ICD-10-CM

## 2017-08-20 LAB — FECAL OCCULT BLOOD, IMMUNOCHEMICAL: FECAL OCCULT BLD: NEGATIVE

## 2017-08-20 LAB — FECAL OCCULT BLOOD, GUAIAC: FECAL OCCULT BLD: NEGATIVE

## 2017-10-29 LAB — HEPATIC FUNCTION PANEL
ALT: 33 (ref 10–40)
AST: 35 (ref 14–40)
Alkaline Phosphatase: 57 (ref 25–125)
Bilirubin, Total: 1.3

## 2017-10-29 LAB — CBC AND DIFFERENTIAL
Hemoglobin: 17.3 (ref 13.5–17.5)
Platelets: 256 (ref 150–399)
WBC: 6.6

## 2017-10-29 LAB — BASIC METABOLIC PANEL
Creatinine: 1.1 (ref 0.6–1.3)
Glucose: 69
POTASSIUM: 4.1 (ref 3.4–5.3)
Sodium: 138 (ref 137–147)

## 2017-10-29 LAB — LIPID PANEL
CHOLESTEROL: 248 — AB (ref 0–200)
HDL: 57 (ref 35–70)
LDL CALC: 170
Triglycerides: 105 (ref 40–160)

## 2017-10-29 LAB — HEMOGLOBIN A1C: Hemoglobin A1C: 5.8

## 2017-10-29 LAB — PSA: PSA: 0.8

## 2017-10-29 LAB — VITAMIN D 25 HYDROXY (VIT D DEFICIENCY, FRACTURES): VIT D 25 HYDROXY: 22.3

## 2017-10-29 LAB — TSH: TSH: 2.19 (ref 0.41–5.90)

## 2017-11-04 ENCOUNTER — Ambulatory Visit: Payer: BLUE CROSS/BLUE SHIELD | Admitting: Family Medicine

## 2017-11-05 ENCOUNTER — Ambulatory Visit (INDEPENDENT_AMBULATORY_CARE_PROVIDER_SITE_OTHER): Payer: BLUE CROSS/BLUE SHIELD | Admitting: Family Medicine

## 2017-11-05 ENCOUNTER — Encounter: Payer: Self-pay | Admitting: Family Medicine

## 2017-11-05 VITALS — BP 132/82 | HR 80 | Temp 98.4°F | Wt 211.0 lb

## 2017-11-05 DIAGNOSIS — E559 Vitamin D deficiency, unspecified: Secondary | ICD-10-CM

## 2017-11-05 DIAGNOSIS — E663 Overweight: Secondary | ICD-10-CM

## 2017-11-05 DIAGNOSIS — E785 Hyperlipidemia, unspecified: Secondary | ICD-10-CM

## 2017-11-05 DIAGNOSIS — R030 Elevated blood-pressure reading, without diagnosis of hypertension: Secondary | ICD-10-CM

## 2017-11-05 DIAGNOSIS — R7303 Prediabetes: Secondary | ICD-10-CM

## 2017-11-05 NOTE — Assessment & Plan Note (Signed)
Congratulated on weight loss - pt motivated to continue healthy diet/exercise.

## 2017-11-05 NOTE — Progress Notes (Addendum)
BP 132/82 (BP Location: Left Arm, Patient Position: Sitting, Cuff Size: Normal)   Pulse 80   Temp 98.4 F (36.9 C) (Oral)   Wt 211 lb (95.7 kg)   SpO2 99%   BMI 29.85 kg/m    CC: 3 mo f/u visit Subjective:    Patient ID: Jesse Phillips, male    DOB: 08-26-1965, 52 y.o.   MRN: [id number]  HPI: Jesse Phillips is a 52 y.o. male presenting on 11/05/2017 for 3 mo follow-up (Provided record of recent BP readings. Also, provided copy of labs for 06/2017 and 10/2017.)   Brings recent labs which were reviewed. Chol levels elevated, Vit D 22 (low), A1c 5.8%.   Borderline elevated blood pressure readings over last few years - not on medication. Does check blood pressures at work clinic twice weekly: 120-130/80s.  No low blood pressure readings or symptoms of dizziness/syncope.   Working on [employer], has decreased added sugars in diet. Has started walking 15-40 min daily.   Relevant past medical, surgical, family and social history reviewed and updated as indicated. Interim medical history since our last visit reviewed. Allergies and medications reviewed and updated. Current Outpatient Medications on File Prior to Visit  Medication Sig Dispense Refill  . Cholecalciferol (VITAMIN D) 2000 UNITS CAPS Take 1 capsule by mouth daily.    . Multiple Vitamins-Minerals (CENTRUM ADULTS PO) Take 1 tablet by mouth daily.     No current facility-administered medications on file prior to visit.     Per HPI unless specifically indicated in ROS section below Review of Systems     Objective:    BP 132/82 (BP Location: Left Arm, Patient Position: Sitting, Cuff Size: Normal)   Pulse 80   Temp 98.4 F (36.9 C) (Oral)   Wt 211 lb (95.7 kg)   SpO2 99%   BMI 29.85 kg/m   Wt Readings from Last 3 Encounters:  11/05/17 211 lb (95.7 kg)  07/30/17 216 lb (98 kg)  04/11/15 208 lb (94.3 kg)    Physical Exam  Constitutional: He appears well-developed and well-nourished. No distress.  HENT:    Mouth/Throat: Oropharynx is clear and moist. No oropharyngeal exudate.  Eyes: Conjunctivae and EOM are normal. Pupils are equal, round, and reactive to light. No scleral icterus.  Cardiovascular: Normal rate, regular rhythm and intact distal pulses.  Murmur (2/6 systolic at apex) heard. Pulmonary/Chest: Effort normal and breath sounds normal. No respiratory distress. He has no wheezes. He has no rales.  Musculoskeletal: He exhibits no edema.  Psychiatric: He has a normal mood and affect.  Nursing note and vitals reviewed.  Results for orders placed or performed in visit on 08/20/17  Fecal Occult Blood, Guaiac  Result Value Ref Range   Fecal Occult Blood Negative       Assessment & Plan:   Problem List Items Addressed This Visit    Elevated blood pressure reading without diagnosis of hypertension - Primary    Better readings the last few months. Reviewed healthy diet and lifestyle changes to maintain good control.  DASH diet handout provided today. Pt will continue to closely monitor at work clinic.       HLD (hyperlipidemia)    Chronic, elevated. Off statin. The 10-year ASCVD risk score Denman George DC Montez Hageman., et al., 2013) is: 5.9%*   Values used to calculate the score:     Age: 46 years     Sex: Male     Is Non-Hispanic African American: No     Diabetic:  No     Tobacco smoker: No     Systolic Blood Pressure: 132 mmHg     Is BP treated: No     HDL Cholesterol: 52 mg/dL     Total Cholesterol: 256 mg/dL*     * - Cholesterol units were assumed for this score calculation       Overweight (BMI 25.0-29.9)    Congratulated on weight loss - pt motivated to continue healthy diet/exercise.      Prediabetes    Slow but persistent improvement over the years      Vitamin D deficiency    Again deficient - encouraged restart 1000 IU daily.          No orders of the defined types were placed in this encounter.  No orders of the defined types were placed in this  encounter.   Follow up plan: Return after 07/30/2018, for annual exam, prior fasting for blood work.  Eustaquio BoydenJavier Julena Barbour, MD

## 2017-11-05 NOTE — Assessment & Plan Note (Signed)
Chronic, elevated. Off statin. The 10-year ASCVD risk score Denman George(Goff DC Montez HagemanJr., et al., 2013) is: 5.9%*   Values used to calculate the score:     Age: [age] years     Sex: Male     Is Non-Hispanic African American: No     Diabetic: No     Tobacco smoker: No     Systolic Blood Pressure: 132 mmHg     Is BP treated: No     HDL Cholesterol: 52 mg/dL     Total Cholesterol: 256 mg/dL*     * - Cholesterol units were assumed for this score calculation

## 2017-11-05 NOTE — Patient Instructions (Addendum)
Your goal blood pressure is <140/90, lower is better.  Work on low salt/sodium diet - goal <1.5gm (1,500mg ) per day. Eat a diet high in fruits/vegetables and whole grains.  Look into mediterranean and DASH diet.  Goal activity is 12min/wk of moderate intensity exercise.  This can be split into 30 minute chunks.  If you are not at this level, you can start with smaller 10-15 min increments and slowly build up activity. Look at www.heart.org for more resources  Comienze vitamina D dosis mas alta de nuevo.   Plan de alimentacin DASH DASH Eating Plan DASH es la sigla en ingls de "Enfoques Alimentarios para Detener la Hipertensin" (Dietary Approaches to Stop Hypertension). El plan de alimentacin DASH ha demostrado bajar la presin arterial elevada (hipertensin). Tambin puede reducir [employer] de diabetes tipo 2, enfermedad cardaca y accidente cerebrovascular. Este plan tambin puede ayudar a Geophysical data processor. Consejos para seguir este plan [place] generales  Evite ingerir ms de 2,300 mg (miligramos) de sal (sodio) por da. Si tiene hipertensin, es posible que necesite reducir la ingesta de sodio a 1,500 mg por da.  Limite el consumo de alcohol a no ms de por da si es mujer y no est [place], y por da si es hombre. Una medida equivale a 12oz ( ) de cerveza, 5oz ( ) de vino o 1oz (44ml) de bebidas alcohlicas de alta graduacin.  Trabaje con su mdico para mantener un peso saludable o perder [employer]. Pregntele cul es el peso recomendado para usted.  Realice al menos 30 minutos de ejercicio que haga que se acelere su corazn (ejercicio Magazine features editor) la [employer] de la [place]. Estas actividades pueden incluir caminar, nadar o andar en bicicleta.  Trabaje con su mdico o especialista en alimentacin y nutricin (nutricionista) para ajustar su plan alimentario a sus necesidades calricas personales. Lectura de las etiquetas de los alimentos  Verifique en  las etiquetas de los alimentos, la cantidad de sodio por porcin. Elija alimentos con menos del 5 por ciento del valor diario de sodio. Generalmente, los alimentos con menos de 300 mg de sodio por porcin se encuadran dentro de este plan alimentario.  Para encontrar cereales integrales, busque la palabra "integral" como primera palabra en la lista de ingredientes. De compras  Compre productos en los que en su etiqueta diga: "bajo contenido de sodio" o "sin agregado de sal".  Compre alimentos frescos. Evite los alimentos enlatados y comidas precocidas o congeladas. Coccin  Evite agregar sal cuando cocine. Use hierbas o aderezos sin sal, en lugar de sal de mesa o sal marina. Consulte al mdico o farmacutico antes de usar sustitutos de la sal.  No fra los alimentos. A la hora de cocinar los alimentos opte por hornearlos, hervirlos, grillarlos y asarlos a Patent attorney.  Cocine con aceites cardiosaludables, como oliva, canola, soja o girasol. Planificacin de las comidas   Consuma una dieta equilibrada, que incluya lo siguiente: ? 5o ms porciones de frutas y Warehouse manager. Trate de que la mitad del plato de cada comida sean frutas y verduras. ? Hasta 6 u 8 porciones de cereales integrales por da. ? Menos de 6 onzas de carne, aves o pescado Copy. Una porcin de 3 onzas de carne tiene casi el mismo tamao que un mazo de cartas. Un huevo equivale a 1 onza. ? Dos porciones de productos lcteos descremados por Futures trader. ? Una porcin de frutos secos, semillas o frijoles 5 veces por semana. ? Grasas cardiosaludables. Las grasas saludables  llamadas cidos grasos omega-3 se encuentran en alimentos como semillas de lino y pescados de agua fra, como por ejemplo, sardinas, salmn y caballa.  Limite la cantidad que ingiere de los siguientes alimentos: ? Alimentos enlatados o envasados. ? Alimentos con alto contenido de grasa trans, como alimentos fritos. ? Alimentos con alto contenido de  grasa saturada, como carne con grasa. ? Dulces, postres, bebidas azucaradas y otros alimentos con azcar agregada. ? Productos lcteos enteros.  No le agregue sal a los alimentos antes de probarlos.  Trate de comer al menos 2 comidas vegetarianas por semana.  Consuma ms comida casera y menos de restaurante, de bufs y comida rpida.  Cuando coma en un restaurante, pida que preparen su comida con menos sal o, en lo posible, sin nada de sal. Qu alimentos se recomiendan? Los alimentos enumerados a continuacin no constituyen Water quality scientistuna lista completa. Hable con el nutricionista sobre las mejores opciones alimenticias para usted. Cereales Pan de salvado o integral. Pasta de salvado o integral. Arroz integral. Avena. Quinua. Trigo burgol. Cereales integrales y con bajo contenido de Ringwoodsodio. Pan pita. Galletitas de [place]agua con bajo contenido de [place]grasa y Kamrarsodio. Tortillas de [place] integral. Verduras Verduras frescas o congeladas (crudas, al vapor, asadas o grilladas). Jugos de tomate y verduras con bajo contenido de sodio o reducidos en sodio. Salsa y pasta de tomate con bajo contenido de sodio o reducidas en sodio. Verduras enlatadas con bajo contenido de sodio o reducidas en sodio. Frutas Todas las frutas frescas, congeladas o disecadas. Frutas enlatadas en jugo natural (sin agregado de azcar). Carne y otros alimentos proteicos Pollo o pavo sin piel. Carne de pollo o de Pachutapavo molida. Cerdo desgrasado. Pescado y Liberty Globalmariscos. Claras de huevo. Porotos, guisantes o lentejas secos. Frutos secos, mantequilla de frutos secos y semillas sin sal. Frijoles enlatados sin sal. Cortes de carne vacuna magra, desgrasada. Embutidos magros, con bajo contenido de [place] de Fal[place]odio. Lcteos Leche descremada (1%) o descremada. Quesos sin grasa, con bajo contenido de grasa o descremados. Queso blanco o ricota sin grasa, con bajo contenido de Sandovalsodio. Yogur semidescremado o descremado. Queso con bajo contenido de [place]grasa y McNealsodio. Grasas y  Hershey Companyaceites Margarinas untables que no contengan grasas trans. Aceite vegetal. Jerolyn ShinMayonesa y aderezos para ensaladas livianos o con bajo contenido de grasas (reducidos en sodio). Aceite de canola, crtamo, oliva, soja y State Linegirasol. [place]. Condimentos y otros alimentos Hierbas. Especias. Mezclas de condimentos sin sal. Palomitas de maz y pretzels sin sal. Dulces con bajo contenido de grasas. Qu alimentos no se recomiendan? Los alimentos enumerados a continuacin no constituyen Water quality scientistuna lista completa. Hable con el nutricionista sobre las mejores opciones alimenticias para usted. Cereales Productos de panificacin hechos con grasa, como medialunas, magdalenas y algunos panes. Comidas con arroz o pasta seca listas para usar. Verduras Verduras con crema o fritas. Verduras en salsa de River[place]o. Verduras enlatadas regulares (que no sean con bajo contenido de sodio o reducidas en sodio). Pasta y salsa de tomates enlatadas regulares (que no sean con bajo contenido de sodio o reducidas en sodio). Jugos de tomate y verduras regulares (que no sean con bajo contenido de sodio o reducidos en sodio). Pepinillos. Aceitunas. Nils PyleFrutas Fruta enlatada en almbar liviano o espeso. Frutas cocidas en aceite. Frutas con salsa de crema o [place]. Carne y otros alimentos proteicos Cortes de carne con grasa. Costillas. Carne frita. Tocino. Salchichas. Mortadela y otras carnes procesadas. Salame. Panceta. Perros calientes (hotdogs). Salchicha de cerdo. Frutos secos y semillas con sal. Frijoles enlatados con agregado de sal. Pescado enlatado  o ahumado. Huevos enteros o yemas. Pollo o pavo con piel. Lcteos Leche entera o al 2%, crema y [address] y mitad crema. Queso crema entero o con toda su grasa. Yogur entero o endulzado. Quesos con toda su grasa. Sustitutos de cremas no lcteas. Coberturas batidas. Quesos para untar y quesos procesados. Grasas y [employer]. Margarina en barra. Manteca de cerdo. Materia grasa. Mantequilla  clarificada. Grasa de panceta. Aceites tropicales como aceite de coco, palmiste o palma. Condimentos y otros alimentos Palomitas de maz y pretzels con sal. Sal de cebolla, sal de ajo, sal condimentada, sal de mesa y sal marina. Salsa Worcestershire. Salsa trtara. Salsa barbacoa. Salsa teriyaki. Salsa de soja, incluso la que tiene contenido reducido de [place]. Salsa de carne. Salsas en lata y envasadas. Salsa de pescado. Salsa de [place]. Salsa rosada. Rbano picante envasado. [place]. Mostaza. Saborizantes y tiernizantes para carne. Caldo en cubitos. Salsa picante y salsa tabasco. Escabeches envasados o ya preparados. Aderezos para tacos prefabricados o envasados. Salsas. Aderezos comunes para ensalada. Dnde encontrar ms informacin:  [employer] del [address], los Pulmones y Risk manager (National Heart, Lung, and Blood Institute): PopSteam.is  Asociacin Estadounidense del Corazn (American Heart Association): www.heart.org Resumen  El plan de alimentacin DASH ha demostrado bajar la presin arterial elevada (hipertensin). Tambin puede reducir [employer] International de diabetes tipo 2, enfermedad cardaca y accidente cerebrovascular.  Con el plan de alimentacin DASH, deber limitar el consumo de sal (sodio) a 2,300 mg por da. Si tiene hipertensin, es posible que necesite reducir la ingesta de sodio a 1,500 mg por da.  Cuando siga el plan de alimentacin DASH, trate de comer ms frutas frescas y verduras, cereales integrales, carnes magras, lcteos descremados y grasas cardiosaludables.  Trabaje con su mdico o especialista en alimentacin y nutricin (nutricionista) para ajustar su plan alimentario a sus necesidades calricas personales. Esta informacin no tiene Theme park manager el consejo del mdico. Asegrese de hacerle al mdico cualquier pregunta que tenga. Document Released: 07/31/2011 Document Revised: 12/01/2016 Document Reviewed: 12/01/2016 Elsevier Interactive Patient Education   [employer].

## 2017-11-05 NOTE — Assessment & Plan Note (Signed)
Again deficient - encouraged restart 1000 IU daily.

## 2017-11-05 NOTE — Assessment & Plan Note (Signed)
Better readings the last few months. Reviewed healthy diet and lifestyle changes to maintain good control.  DASH diet handout provided today. Pt will continue to closely monitor at work clinic.

## 2017-11-05 NOTE — Assessment & Plan Note (Addendum)
Slow but persistent improvement over the years

## 2017-12-01 DIAGNOSIS — G4733 Obstructive sleep apnea (adult) (pediatric): Secondary | ICD-10-CM | POA: Diagnosis not present

## 2018-05-31 ENCOUNTER — Ambulatory Visit (INDEPENDENT_AMBULATORY_CARE_PROVIDER_SITE_OTHER): Payer: BLUE CROSS/BLUE SHIELD | Admitting: Internal Medicine

## 2018-05-31 ENCOUNTER — Encounter: Payer: Self-pay | Admitting: Internal Medicine

## 2018-05-31 VITALS — BP 138/90 | HR 74 | Resp 16 | Ht 70.0 in | Wt 218.0 lb

## 2018-05-31 DIAGNOSIS — Z9989 Dependence on other enabling machines and devices: Secondary | ICD-10-CM

## 2018-05-31 DIAGNOSIS — E669 Obesity, unspecified: Secondary | ICD-10-CM | POA: Diagnosis not present

## 2018-05-31 DIAGNOSIS — G4733 Obstructive sleep apnea (adult) (pediatric): Secondary | ICD-10-CM

## 2018-05-31 DIAGNOSIS — R011 Cardiac murmur, unspecified: Secondary | ICD-10-CM

## 2018-05-31 DIAGNOSIS — R030 Elevated blood-pressure reading, without diagnosis of hypertension: Secondary | ICD-10-CM

## 2018-05-31 NOTE — Progress Notes (Signed)
[facility] [address] [place], [place] [address]  Pulmonary Sleep Medicine   Office Visit Note  Patient Name: Jesse Phillips DOB: [birth year] MRN [id number]  Date of Service: 05/31/2018  Complaints/HPI: Pt here for follow up on OSA.  He reports he is wearing his CPAP every night.  He changes his seal and components every few weeks or months, when the company ships him the supplies.  He is cleaning the machine with vinegar and water.  He denies cough, sinus issues or chest pain. He reports his sleeping has been much better since starting the CPAP.  He denies EDS, or snoring at this time.    ROS  General: (-) fever, (-) chills, (-) night sweats, (-) weakness Skin: (-) rashes, (-) itching,. Eyes: (-) visual changes, (-) redness, (-) itching. Nose and Sinuses: (-) nasal stuffiness or itchiness, (-) postnasal drip, (-) nosebleeds, (-) sinus trouble. Mouth and Throat: (-) sore throat, (-) hoarseness. Neck: (-) swollen glands, (-) enlarged thyroid, (-) neck pain. Respiratory: - cough, (-) bloody sputum, - shortness of breath, - wheezing. Cardiovascular: - ankle swelling, (-) chest pain. Lymphatic: (-) lymph node enlargement. Neurologic: (-) numbness, (-) tingling. Psychiatric: (-) anxiety, (-) depression   Current Medication: Outpatient Encounter Medications as of 05/31/2018  Medication Sig  . Cholecalciferol (VITAMIN D) 2000 UNITS CAPS Take 1 capsule by mouth daily.  . Multiple Vitamins-Minerals (CENTRUM ADULTS PO) Take 1 tablet by mouth daily.   No facility-administered encounter medications on file as of 05/31/2018.     Surgical History: Past Surgical History:  Procedure Laterality Date  . NO PAST SURGERIES      Medical History: Past Medical History:  Diagnosis Date  . HLD (hyperlipidemia)   . HTN (hypertension)   . OSA on CPAP    compliant  . Prediabetes   . Vitamin D deficiency     Family History: Family History  Problem Relation Age of Onset  .  Hypertension Father   . Cancer Brother 66       prostate  . Depression Brother        deceased (homosexual)  . Deep vein thrombosis Brother        deceased  . Diabetes Neg Hx   . CAD Neg Hx   . Stroke Neg Hx     Social History: Social History   Socioeconomic History  . Marital status: Married    Spouse name: Not on file  . Number of children: Not on file  . Years of education: Not on file  . Highest education level: Not on file  Occupational History  . Not on file  Social Needs  . Financial resource strain: Not on file  . Food insecurity:    Worry: Not on file    Inability: Not on file  . Transportation needs:    Medical: Not on file    Non-medical: Not on file  Tobacco Use  . Smoking status: Never Smoker  . Smokeless tobacco: Never Used  Substance and Sexual Activity  . Alcohol use: No    Alcohol/week: 0.0 standard drinks  . Drug use: No  . Sexual activity: Not on file  Lifestyle  . Physical activity:    Days per week: Not on file    Minutes per session: Not on file  . Stress: Not on file  Relationships  . Social connections:    Talks on phone: Not on file    Gets together: Not on file    Attends religious service:  Not on file    Active member of club or organization: Not on file    Attends meetings of clubs or organizations: Not on file    Relationship status: Not on file  . Intimate partner violence:    Fear of current or ex partner: Not on file    Emotionally abused: Not on file    Physically abused: Not on file    Forced sexual activity: Not on file  Other Topics Concern  . Not on file  Social History Narrative   Lives with Riki Altes wife and 1 son   Occupation: Location manager   From [place]   Edu: HS   Activity: occasionally gym   Diet: likes but avoids sweets and carbs, wife cooks healthy     Vital Signs: Blood pressure 138/90, pulse 74, resp. rate 16, height 5\' 10"  (1.778 m), weight 218 lb (98.9 kg), SpO2 98 %.  Examination: General  Appearance: The patient is well-developed, well-nourished, and in no distress. Skin: Gross inspection of skin unremarkable. Head: normocephalic, no gross deformities. Eyes: no gross deformities noted. ENT: ears appear grossly normal no exudates. Neck: Supple. No thyromegaly. No LAD. Respiratory: Clear to aucultation bilateraly. Cardiovascular: Normal S1 and S2 without murmur or rub. Extremities: No cyanosis. pulses are equal. Neurologic: Alert and oriented. No involuntary movements.  LABS: No results found for this or any previous visit (from the past 2160 hour(s)).  Radiology: Patient was never admitted.  No results found.  No results found.    Assessment and Plan: Patient Active Problem List   Diagnosis Date Noted  . Cardiac murmur 07/30/2017  . Elevated blood pressure reading without diagnosis of hypertension 07/30/2017  . Health maintenance examination 04/11/2015  . Overweight (BMI 25.0-29.9) 10/12/2014  . HLD (hyperlipidemia)   . OSA on CPAP   . Prediabetes   . Vitamin D deficiency    1. OSA on CPAP Pt doing well with CPAP.  Discussed continued compliance.  Also discussed that he should not be driving if he does not wear his CPAP due to the possibility of falling asleep at the wheel.   2. Elevated blood pressure reading without diagnosis of hypertension Pt has not had an elevated blood pressure recently.  He reports he has lost weight since initial HTN episodes.  Will continue to monitor however, he is not HTN at this time.   3. Obesity (BMI 30.0-34.9) Obesity Counseling: Risk Assessment: An assessment of behavioral risk factors was made today and includes lack of exercise sedentary lifestyle, lack of portion control and poor dietary habits.  Risk Modification Advice: She was counseled on portion control guidelines. Restricting daily caloric intake to. . The detrimental long term effects of obesity on her health and ongoing poor compliance was also discussed with the  patient.  4. Cardiac murmur I did not appreciate at cardiac murmur at this visit.  Pt is being monitored by his primary doctor for this currently.     General Counseling: I have discussed the findings of the evaluation and examination with Sebastien.  I have also discussed any further diagnostic evaluation thatmay be needed or ordered today. Derral verbalizes understanding of the findings of todays visit. We also reviewed his medications today and discussed drug interactions and side effects including but not limited excessive drowsiness and altered mental states. We also discussed that there is always a risk not just to him but also people around him. he has been encouraged to call the office with any questions or concerns that  should arise related to todays visit.    Time spent: 25 This patient was seen by Orson Gear AGNP-C in Collaboration with Dr. Devona Konig as a part of collaborative care agreement.   I have personally obtained a history, examined the patient, evaluated laboratory and imaging results, formulated the assessment and plan and placed orders.    Allyne Gee, MD [facility] Pulmonary and Critical Care Sleep medicine

## 2018-05-31 NOTE — Patient Instructions (Signed)

## 2018-07-08 LAB — BASIC METABOLIC PANEL
CREATININE: 1 (ref 0.6–1.3)
Glucose: 75
POTASSIUM: 4.3 (ref 3.4–5.3)
SODIUM: 140 (ref 137–147)

## 2018-07-08 LAB — HEPATIC FUNCTION PANEL
ALT: 38 (ref 10–40)
AST: 32 (ref 14–40)
Alkaline Phosphatase: 56 (ref 25–125)
BILIRUBIN, TOTAL: 0.9

## 2018-07-08 LAB — LIPID PANEL
CHOLESTEROL: 246 — AB (ref 0–200)
HDL: 53 (ref 35–70)
LDL Cholesterol: 171
Triglycerides: 111 (ref 40–160)

## 2018-07-08 LAB — HEMOGLOBIN A1C: Hemoglobin A1C: 5.6

## 2018-07-08 LAB — VITAMIN D 25 HYDROXY (VIT D DEFICIENCY, FRACTURES): Vit D, 25-Hydroxy: 37

## 2018-08-05 NOTE — Progress Notes (Signed)
BP (!) 150/98 (BP Location: Right Arm, Patient Position: Sitting, Cuff Size: Normal)   Pulse 83   Temp 98.4 F (36.9 C) (Oral)   Ht 5' 10.5" (1.791 m)   Wt 210 lb 8 oz (95.5 kg)   SpO2 95%   BMI 29.78 kg/m   Again elevated on repeat to 170/100 - white coat hypertension  CC: CPE Subjective:    Patient ID: Jesse Phillips, male    DOB: Mar 25, 1966, 52 y.o.   MRN: [id number]  HPI: Jesse Phillips is a 52 y.o. male presenting on 08/06/2018 for Annual Exam (Pt provided copy (made copies) or recent labs and BP readings. )   OSA on CPAP followed by Dr Welton Flakes.  BP elevated today - but brings home bp readings which are well controlled. Checks twice weekly. Told white coat hypertension.   Preventative: Colon cancer screening - iFOB negative 07/2017. Would like to repeat.  Prostate cancer screening: brother passed away from prostate cancer age 50. We are screening yearly.  Flu shot yearly Tdap 10/2014  Seat belt use discussed Sunscreen use discussed. No changing mole on skin Non smoker Alcohol - rare  Dentist yearly  Eye exam yearly  Lives with Riki Altes wife and 1 son  Occupation: Location manager  From [place]  Edu: HS  Activity: occasionally gym, walking  Diet: wife cooks healthy, good water  Relevant past medical, surgical, family and social history reviewed and updated as indicated. Interim medical history since our last visit reviewed. Allergies and medications reviewed and updated. Outpatient Medications Prior to Visit  Medication Sig Dispense Refill  . Cholecalciferol (VITAMIN D) 2000 UNITS CAPS Take 1 capsule by mouth daily.    . Multiple Vitamins-Minerals (CENTRUM ADULTS PO) Take 1 tablet by mouth daily.     No facility-administered medications prior to visit.      Per HPI unless specifically indicated in ROS section below Review of Systems  Constitutional: Negative for activity change, appetite change, chills, fatigue, fever and unexpected weight change.  HENT: Negative  for hearing loss.   Eyes: Negative for visual disturbance.  Respiratory: Negative for cough, chest tightness, shortness of breath and wheezing.   Cardiovascular: Negative for chest pain, palpitations and leg swelling.  Gastrointestinal: Negative for abdominal distention, abdominal pain, blood in stool, constipation, diarrhea, nausea and vomiting.  Genitourinary: Negative for difficulty urinating and hematuria.  Musculoskeletal: Negative for arthralgias, myalgias and neck pain.  Skin: Negative for rash.  Neurological: Negative for dizziness, seizures, syncope and headaches.  Hematological: Negative for adenopathy. Does not bruise/bleed easily.  Psychiatric/Behavioral: Negative for dysphoric mood. The patient is not nervous/anxious.        Objective:    BP (!) 150/98 (BP Location: Right Arm, Patient Position: Sitting, Cuff Size: Normal)   Pulse 83   Temp 98.4 F (36.9 C) (Oral)   Ht 5' 10.5" (1.791 m)   Wt 210 lb 8 oz (95.5 kg)   SpO2 95%   BMI 29.78 kg/m   Wt Readings from Last 3 Encounters:  08/06/18 210 lb 8 oz (95.5 kg)  05/31/18 218 lb (98.9 kg)  11/05/17 211 lb (95.7 kg)    Physical Exam Vitals signs and nursing note reviewed.  Constitutional:      General: He is not in acute distress.    Appearance: He is well-developed.  HENT:     Head: Normocephalic and atraumatic.     Right Ear: Hearing, tympanic membrane, ear canal and external ear normal.     Left Ear: Hearing,  tympanic membrane, ear canal and external ear normal.     Nose: Nose normal.     Mouth/Throat:     Pharynx: Uvula midline. No oropharyngeal exudate or posterior oropharyngeal erythema.  Eyes:     General: No scleral icterus.    Conjunctiva/sclera: Conjunctivae normal.     Pupils: Pupils are equal, round, and reactive to light.  Neck:     Musculoskeletal: Normal range of motion and neck supple.     Vascular: No carotid bruit.  Cardiovascular:     Rate and Rhythm: Normal rate and regular rhythm.      Pulses: Normal pulses.          Radial pulses are 2+ on the right side and 2+ on the left side.     Heart sounds: Normal heart sounds. No murmur (not appreciated today).  Pulmonary:     Effort: Pulmonary effort is normal. No respiratory distress.     Breath sounds: Normal breath sounds. No wheezing or rales.  Abdominal:     General: Bowel sounds are normal. There is no distension.     Palpations: Abdomen is soft. There is no mass.     Tenderness: There is no abdominal tenderness. There is no guarding or rebound.     Hernia: A hernia is present. Hernia is present in the umbilical area.  Genitourinary:    Prostate: Normal. Not enlarged (20gm).     Rectum: Normal. No mass, tenderness, anal fissure, external hemorrhoid or internal hemorrhoid. Normal anal tone.  Musculoskeletal: Normal range of motion.  Lymphadenopathy:     Cervical: No cervical adenopathy.  Skin:    General: Skin is warm and dry.     Findings: No rash.  Neurological:     Mental Status: He is alert and oriented to person, place, and time.     Comments: CN grossly intact, station and gait intact  Psychiatric:        Behavior: Behavior normal.        Thought Content: Thought content normal.        Judgment: Judgment normal.    Results for orders placed or performed in visit on 11/05/17  CBC and differential  Result Value Ref Range   Hemoglobin 17.3 13.5 - 17.5   Platelets 256 150 - 399   WBC 6.6   VITAMIN D 25 Hydroxy (Vit-D Deficiency, Fractures)  Result Value Ref Range   Vit D, 25-Hydroxy 22.3   Basic metabolic panel  Result Value Ref Range   Glucose 69    Creatinine 1.1 0.6 - 1.3   Potassium 4.1 3.4 - 5.3   Sodium 138 137 - 147  Lipid panel  Result Value Ref Range   Triglycerides 105 40 - 160   Cholesterol 248 (A) 0 - 200   HDL 57 35 - 70   LDL Cholesterol 170   Hepatic function panel  Result Value Ref Range   Alkaline Phosphatase 57 25 - 125   ALT 33 10 - 40   AST 35 14 - 40   Bilirubin, Total 1.3    Hemoglobin A1c  Result Value Ref Range   Hemoglobin A1C 5.8   PSA  Result Value Ref Range   PSA 0.8   TSH  Result Value Ref Range   TSH 2.19 0.41 - 5.90      Assessment & Plan:   Problem List Items Addressed This Visit    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 will continue to monitor, let me know if BP trends up.       Vitamin D deficiency    Now normal - continue replacement.       Prediabetes    Levels have normalized this year.       Overweight (BMI 25.0-29.9)    Encouraged ongoing efforts at weight control with healthy diet and lifestyle changes.       OSA on CPAP    Appreciate pulm care.       HLD (hyperlipidemia)    Chronic off statin, encouraged low chol diet.  The 10-year ASCVD risk score Denman George(Goff DC Montez HagemanJr., et al., 2013) is: 7%*   Values used to calculate the score:     Age: [age] years     Sex: Male     Is Non-Hispanic African American: No     Diabetic: No     Tobacco smoker: No     Systolic Blood Pressure: 150 mmHg     Is BP treated: No     HDL Cholesterol: 57 mg/dL*     Total Cholesterol: 248 mg/dL*     * - Cholesterol units were assumed for this score calculation       Health maintenance examination - Primary    Preventative protocols reviewed and updated unless pt declined. Discussed healthy diet and lifestyle.       Cardiac murmur    Not appreciated today.       Other Visit Diagnoses    Special screening for malignant neoplasms, colon       Relevant Orders   Fecal occult blood, imunochemical       No orders of the defined types were placed in this encounter.  Orders Placed This Encounter  Procedures  . Fecal occult blood, imunochemical    Standing Status:   Future    Standing Expiration Date:   08/07/2019    Follow up plan: Return in about 1 year (around 08/07/2019) for annual exam, prior fasting for blood work.  Eustaquio BoydenJavier  Mirjana Tarleton, MD

## 2018-08-05 NOTE — Assessment & Plan Note (Signed)
Preventative protocols reviewed and updated unless pt declined. Discussed healthy diet and lifestyle.  

## 2018-08-06 ENCOUNTER — Ambulatory Visit (INDEPENDENT_AMBULATORY_CARE_PROVIDER_SITE_OTHER): Payer: BLUE CROSS/BLUE SHIELD | Admitting: Family Medicine

## 2018-08-06 ENCOUNTER — Encounter: Payer: Self-pay | Admitting: Family Medicine

## 2018-08-06 VITALS — BP 150/98 | HR 83 | Temp 98.4°F | Ht 70.5 in | Wt 210.5 lb

## 2018-08-06 DIAGNOSIS — E785 Hyperlipidemia, unspecified: Secondary | ICD-10-CM | POA: Diagnosis not present

## 2018-08-06 DIAGNOSIS — R011 Cardiac murmur, unspecified: Secondary | ICD-10-CM

## 2018-08-06 DIAGNOSIS — R030 Elevated blood-pressure reading, without diagnosis of hypertension: Secondary | ICD-10-CM

## 2018-08-06 DIAGNOSIS — Z0000 Encounter for general adult medical examination without abnormal findings: Secondary | ICD-10-CM | POA: Diagnosis not present

## 2018-08-06 DIAGNOSIS — G4733 Obstructive sleep apnea (adult) (pediatric): Secondary | ICD-10-CM

## 2018-08-06 DIAGNOSIS — E663 Overweight: Secondary | ICD-10-CM | POA: Diagnosis not present

## 2018-08-06 DIAGNOSIS — Z1211 Encounter for screening for malignant neoplasm of colon: Secondary | ICD-10-CM

## 2018-08-06 DIAGNOSIS — Z9989 Dependence on other enabling machines and devices: Secondary | ICD-10-CM

## 2018-08-06 DIAGNOSIS — E559 Vitamin D deficiency, unspecified: Secondary | ICD-10-CM

## 2018-08-06 DIAGNOSIS — R7303 Prediabetes: Secondary | ICD-10-CM

## 2018-08-06 NOTE — Assessment & Plan Note (Addendum)
Chronic off statin, encouraged low chol diet.  The 10-year ASCVD risk score Denman George(Goff DC Montez HagemanJr., et al., 2013) is: 7%*   Values used to calculate the score:     Age: [age] years     Sex: Male     Is Non-Hispanic African American: No     Diabetic: No     Tobacco smoker: No     Systolic Blood Pressure: 150 mmHg     Is BP treated: No     HDL Cholesterol: 57 mg/dL*     Total Cholesterol: 248 mg/dL*     * - Cholesterol units were assumed for this score calculation

## 2018-08-06 NOTE — Assessment & Plan Note (Signed)
Levels have normalized this year. 

## 2018-08-06 NOTE — Patient Instructions (Addendum)
Siga dieta baja en colesterol para ayudar niveles.  Pase por laboratorio hoy para recibir examen de heces.  Gusto verlo hoy. Llamenos con preguntas.  Regresar en 1 ao para proximo fisico.  Mantenimiento de la salud en los hombres (Health Maintenance, Male) Un estilo de vida saludable y los cuidados preventivos son importantes para la salud y Counsellorel bienestar. Pregntele al mdico cul es el cronograma de exmenes peridicos adecuado para usted. QU DEBO SABER SOBRE EL PESO Y LA DIETA? Consuma una dieta saludable  Coma muchas verduras, frutas, cereales integrales, productos lcteos con bajo contenido de grasa y Associate Professorprotenas magras.  No consuma muchos alimentos de alto contenido de grasas slidas, azcares agregados o sal. Mantenga un peso saludable La actividad fsica habitual puede ayudarlo a Baristaalcanzar o mantener un peso saludable. Deber hacer lo siguiente:  Realizar al menos 150minutos de actividad fsica por semana. El ejercicio debe aumentar la frecuencia cardaca y Development worker, international aidprovocar la transpiracin (ejercicio de Brooksideintensidad moderada).  Hacer ejercicios de entrenamiento de fuerza por lo Rite Aidmenos dos veces por semana. Controlarse los niveles de colesterol y lpidos en la sangre  Hgase anlisis de sangre para controlar los lpidos y el colesterol cada 5aos a partir de los 35aos. Si tiene un riesgo alto de Warehouse managertener cardiopatas coronarias, debe comenzar a Assuranthacerse anlisis de Cl[place]re a los [place]20aos. Es posible que Insurance underwriternecesite controlar los niveles de colesterol con mayor frecuencia si: ? Sus niveles de lpidos y colesterol son altos. ? Es mayor de [id number]. ? Tiene un riesgo alto de tener cardiopatas coronarias. QU DEBO SABER SOBRE LAS PRUEBAS DE DETECCIN DEL CNCER? Muchos tipos de cncer se pueden detectar de manera temprana y a menudo prevenirse. Cncer de pulmn  Debe someterse a pruebas de deteccin de cncer de pulmn todos los aos en los siguientes casos: ? Si fuma actualmente y lo ha hecho  durante por lo menos 30aos. ? Si fue fumador que dej el hbito en el trmino de los ltimos 15aos.  Hable con el mdico sobre las opciones en relacin con los estudios de deteccin, cundo debe comenzar a Actuaryhacrselos y con Engineer, structuralqu frecuencia. Cncer colorrectal  Generalmente, las pruebas de deteccin habituales del cncer colorrectal comienzan a los 50aos y deben repetirse cada 5 a 10aos hasta los 75aos. Es posible que tenga que hacerse las pruebas con mayor frecuencia si se detectan formas tempranas de plipos precancerosos o pequeos bultos. Sin embargo, el mdico podr aconsejarle que lo haga antes, si tiene factores de riesgo para el cncer de colon.  El mdico puede recomendarle que use kits de prueba caseros para Recruitment consultanthallar sangre oculta en la materia fecal.  Se puede usar una pequea cmara en el extremo de un tubo para examinar el colon (sigmoidoscopia o colonoscopia). Este estudio PPG Industriesdetecta las formas ms tempranas de Building services engineercncer colorrectal. Cncer de prstata y de testculo  En funcin de la edad y del Enterpriseestado de salud general, el mdico puede realizarle determinados estudios de deteccin del cncer de prstata y de testculo.  Hable con el mdico sobre cualquier sntoma o acerca de las inquietudes que tenga sobre el cncer de prstata o de testculo. Cncer de piel  Revise la piel de la cabeza a los pies con regularidad.  Informe al mdico si aparecen nuevos lunares o si nota cambios en los que ya tiene, especialmente en estos casos: ? Si hay un cambio en el tamao, la forma o el color del lunar. ? Si tiene un lunar que es ms grande que el tamao de Glenoldenuna goma  de lpiz.  Siempre use pantalla solar. Aplquese pantalla solar de [place] y repetida a lo largo del Futures trader.  Use mangas y Automatic Data, un sombrero de ala ancha y gafas para el sol cuando est al Guadalupe Dawn, para protegerse. QU DEBO SABER SOBRE LAS CARDIOPATAS CORONARIAS, LA DIABETES Y LA HIPERTENSIN  ARTERIAL?  Si usted tiene entre 18 y 39aos, debe medirse la presin arterial cada 3a 5aos. Si usted tiene 40aos o ms, debe medirse la presin arterial [employer]. Debe medirse la presin arterial dos veces: una vez cuando est en un hospital o una clnica y la otra vez cuando est en otro sitio. Registre el promedio de [employer]. Para controlar su presin arterial cuando no est en un hospital o Paulita Cradle, puede usar lo siguiente: ? Valere Dross automtica para medir la presin arterial en una farmacia. ? Un monitor para medir la presin arterial en el hogar.  Hable con el mdico [employer] ideales de la presin arterial.  Si tiene entre 45 y 79aos, consltele al mdico si debe tomar aspirina para evitar las cardiopatas coronarias.  Hgase anlisis habituales de deteccin de la diabetes; para ello, contrlese la glucemia en ayunas. ? Si su peso es normal y tiene un bajo riesgo de padecer diabetes, realcese este anlisis cada tres aos despus de los 45aos. ? Si tiene sobrepeso y un alto riesgo de padecer diabetes, considere someterse a este anlisis antes o con mayor frecuencia.  Para los hombres que tienen entre 65 y 72aos, y son o han sido fumadores, se recomienda un nico estudio con ecografa para Engineer, manufacturing un aneurisma de aorta abdominal (AAA). QU DEBO SABER SOBRE LA PREVENCIN DE LAS INFECCIONES? HepatitisB Si tiene un riesgo ms alto de Primary school teacher hepatitis B, debe someterse a un examen de deteccin de este virus. Hable con el mdico para determinar si corre riesgo de tener una infeccin por hepatitisB. Hepatitis C Se recomienda un anlisis de [place] para:  Todos los que nacieron entre 1945 y [phone].  Todas las personas que tengan un riesgo de haber contrado hepatitis C. Enfermedades de transmisin sexual (ETS)  Debe realizarse pruebas de Airline pilot de las ETS todos los aos, incluidas la gonorrea y la clamidia, en estos casos: ? Es sexualmente  activo y es menor de [place]. ? Es mayor de 24aos, y Public affairs consultant informa que corre riesgo de tener este tipo de infecciones. ? La actividad sexual ha cambiado desde que le hicieron la ltima prueba de deteccin y tiene un riesgo mayor de Warehouse manager clamidia o Copy. Pregntele al mdico si usted tiene riesgo.  Consulte a su mdico para saber si tiene un alto riesgo de infectarse por el VIH. El mdico puede recomendarle un medicamento de venta con receta para ayudar a evitar la infeccin por el VIH. QU MS PUEDO HACER?  Realcese los estudios de rutina de la salud, dentales y de Wellsite geologist.  Mantngase al da con las vacunas (inmunizaciones).  No consuma ningn producto que contenga tabaco, lo que incluye cigarrillos, tabaco de Theatre manager y Administrator, Civil Service. Si necesita ayuda para dejar de fumar, consulte al mdico.  Limite el consumo de alcohol a no ms de por da. [employer] a 12 onzas de cerveza, 5onzas de vino o 1onzas de bebidas alcohlicas de alta graduacin.  No consuma drogas.  No comparta agujas.  Solicite ayuda a su mdico si necesita apoyo o informacin para abandonar las drogas.  Informe a su mdico si a  menudo se siente deprimido.  Notifique a su mdico si alguna vez ha sido vctima de abuso o si no se siente seguro en su hogar. Esta informacin no tiene Theme park manager el consejo del mdico. Asegrese de hacerle al mdico cualquier pregunta que tenga. Document Released: 02/07/2008 Document Revised: 09/01/2014 Document Reviewed: 05/15/2015 Elsevier Interactive Patient Education  [employer].

## 2018-08-06 NOTE — Assessment & Plan Note (Signed)
Encouraged ongoing efforts at weight control with healthy diet and lifestyle changes.

## 2018-08-06 NOTE — Assessment & Plan Note (Signed)
Appreciate pulm care.  ?

## 2018-08-06 NOTE — Assessment & Plan Note (Signed)
Now normal - continue replacement.

## 2018-08-06 NOTE — Assessment & Plan Note (Signed)
Not appreciated today.  

## 2018-08-06 NOTE — Assessment & Plan Note (Addendum)
Brings log of bp over the past year - well controlled all 110-130s/70-80s. will continue to monitor, let me know if BP trends up.

## 2018-08-13 ENCOUNTER — Other Ambulatory Visit (INDEPENDENT_AMBULATORY_CARE_PROVIDER_SITE_OTHER): Payer: BLUE CROSS/BLUE SHIELD

## 2018-08-13 DIAGNOSIS — Z1211 Encounter for screening for malignant neoplasm of colon: Secondary | ICD-10-CM | POA: Diagnosis not present

## 2018-08-13 LAB — FECAL OCCULT BLOOD, IMMUNOCHEMICAL: Fecal Occult Bld: NEGATIVE

## 2018-08-13 LAB — FECAL OCCULT BLOOD, GUAIAC: Fecal Occult Blood: NEGATIVE

## 2018-08-16 ENCOUNTER — Encounter: Payer: Self-pay | Admitting: Family Medicine

## 2018-08-30 ENCOUNTER — Other Ambulatory Visit: Payer: Self-pay

## 2018-08-30 ENCOUNTER — Emergency Department: Payer: BLUE CROSS/BLUE SHIELD

## 2018-08-30 ENCOUNTER — Emergency Department
Admission: EM | Admit: 2018-08-30 | Discharge: 2018-08-30 | Disposition: A | Discharge status: Home / Self Care | Payer: BLUE CROSS/BLUE SHIELD | Attending: Emergency Medicine | Admitting: Emergency Medicine

## 2018-08-30 ENCOUNTER — Telehealth: Payer: Self-pay

## 2018-08-30 DIAGNOSIS — I10 Essential (primary) hypertension: Secondary | ICD-10-CM | POA: Insufficient documentation

## 2018-08-30 DIAGNOSIS — Z79899 Other long term (current) drug therapy: Secondary | ICD-10-CM | POA: Diagnosis not present

## 2018-08-30 DIAGNOSIS — R0789 Other chest pain: Secondary | ICD-10-CM | POA: Diagnosis not present

## 2018-08-30 DIAGNOSIS — R079 Chest pain, unspecified: Secondary | ICD-10-CM | POA: Diagnosis not present

## 2018-08-30 DIAGNOSIS — R531 Weakness: Secondary | ICD-10-CM | POA: Diagnosis not present

## 2018-08-30 LAB — BASIC METABOLIC PANEL
Anion gap: 9 (ref 5–15)
BUN: 17 mg/dL (ref 6–20)
CO2: 24 mmol/L (ref 22–32)
CREATININE: 0.99 mg/dL (ref 0.61–1.24)
Calcium: 9.7 mg/dL (ref 8.9–10.3)
Chloride: 103 mmol/L (ref 98–111)
GFR calc Af Amer: >60 mL/min (ref >60)
GFR calc non Af Amer: >60 mL/min (ref >60)
Glucose, Bld: 141 mg/dL — ABNORMAL HIGH (ref 70–99)
Potassium: 3.5 mmol/L (ref 3.5–5.1)
Sodium: 136 mmol/L (ref 135–145)

## 2018-08-30 LAB — CBC
HCT: 51.9 % (ref 39.0–52.0)
Hemoglobin: 17.3 g/dL — ABNORMAL HIGH (ref 13.0–17.0)
MCH: 29.4 pg (ref 26.0–34.0)
MCHC: 33.3 g/dL (ref 30.0–36.0)
MCV: 88.3 fL (ref 80.0–100.0)
Platelets: 265 10*3/uL (ref 150–400)
RBC: 5.88 MIL/uL — ABNORMAL HIGH (ref 4.22–5.81)
RDW: 12.8 % (ref 11.5–15.5)
WBC: 8.4 10*3/uL (ref 4.0–10.5)
nRBC: 0 % (ref 0.0–0.2)

## 2018-08-30 LAB — TROPONIN I: Troponin I: <0.03 ng/mL (ref <0.03)

## 2018-08-30 NOTE — Telephone Encounter (Signed)
Noted. Will await ER eval.  

## 2018-08-30 NOTE — ED Provider Notes (Signed)
[facility] Emergency Department Provider Note   ____________________________________________    I have reviewed the triage vital signs and the nursing notes.   HISTORY  Chief Complaint Chest Pain and Weakness     HPI Jesse Phillips is a 53 y.o. male who presents for evaluation of chest discomfort that he had today.  He notes that over the last 2 weeks he has been having elevated blood pressure, he attributes this to alcohol and salty foods, he is trying to cut back on these things.  He reports this morning he had discomfort in his upper chest bilaterally.  He describes his pain is mild to moderate.  No radiation.  This has essentially resolved.  No shortness of breath or pleurisy.  No calf pain or swelling.  No fevers or chills.  No cough.   Past Medical History:  Diagnosis Date  . HLD (hyperlipidemia)   . HTN (hypertension)   . OSA on CPAP    compliant  . Prediabetes   . Vitamin D deficiency     Patient Active Problem List   Diagnosis Date Noted  . Cardiac murmur 07/30/2017  . 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 Health maintenance examination 04/11/2015  . Overweight (BMI 25.0-29.9) 10/12/2014  . HLD (hyperlipidemia)   . OSA on CPAP   . Prediabetes   . Vitamin D deficiency     Past Surgical History:  Procedure Laterality Date  . NO PAST SURGERIES      Prior to Admission medications   Medication Sig Start Date End Date Taking? Authorizing Provider  Cholecalciferol (VITAMIN D) 2000 UNITS CAPS Take 1 capsule by mouth daily.    [provider]  Multiple Vitamins-Minerals (CENTRUM ADULTS PO) Take 1 tablet by mouth daily.    [provider]     Allergies Patient has no known allergies.  Family History  Problem Relation Age of Onset  . Hypertension Father   . Hyperlipidemia Father   . Cancer Brother 41       prostate  . Depression Brother        deceased (homosexual)    . Deep vein thrombosis Brother        deceased  . Diabetes Neg Hx   . CAD Neg Hx   . Stroke Neg Hx     Social History Social History   Tobacco Use  . Smoking status: Never Smoker  . Smokeless tobacco: Never Used  Substance Use Topics  . Alcohol use: Yes    Alcohol/week: 1.0 standard drinks    Types: 1 Cans of beer per week    Comment: 1 beer every day and 8 beers a day on weekend  . Drug use: No    Review of Systems  Constitutional: No fever/chills Eyes: No visual changes.  ENT: No sore throat. Cardiovascular: As above Respiratory: Denies shortness of breath. Gastrointestinal: No abdominal pain.  No nausea, no vomiting.   Genitourinary: Negative for dysuria. Musculoskeletal: Negative for back pain. Skin: Negative for rash. Neurological: Negative for headaches or weakness   ____________________________________________   PHYSICAL EXAM:  VITAL SIGNS: ED Triage Vitals  Enc Vitals Group     BP 08/30/18 1242 (!) 186/88     Pulse Rate 08/30/18 1242 (!) 103     Resp 08/30/18 1242 15     Temp 08/30/18 1242 98.2 F (36.8 C)     Temp Source 08/30/18 1242 Oral     SpO2 08/30/18 1242  100 %     Weight 08/30/18 1243 95.3 kg (210 lb)     Height 08/30/18 1243 1.778 m (5\' 10" )     Head Circumference --      Peak Flow --      Pain Score 08/30/18 1243 0     Pain Loc --      Pain Edu? --      Excl. in GC? --     Constitutional: Alert and oriented. Eyes: Conjunctivae are normal.   Nose: No congestion/rhinnorhea. Mouth/Throat: Mucous membranes are moist.   Cardiovascular: Normal rate, regular rhythm. Grossly normal heart sounds.  Good peripheral circulation. Respiratory: Normal respiratory effort.  No retractions. Lungs CTAB. Gastrointestinal: Soft and nontender. No distention.  No CVA tenderness.  Musculoskeletal: .  Warm and well perfused Neurologic:  Normal speech and language. No gross focal neurologic deficits are appreciated.  Skin:  Skin is warm, dry and  intact. No rash noted. Psychiatric: Mood and affect are normal. Speech and behavior are normal.  ____________________________________________   LABS (all labs ordered are listed, but only abnormal results are displayed)  Labs Reviewed  BASIC METABOLIC PANEL - Abnormal; Notable for the following components:      Result Value   Glucose, Bld 141 (*)    All other components within normal limits  CBC - Abnormal; Notable for the following components:   RBC 5.88 (*)    Hemoglobin 17.3 (*)    All other components within normal limits  TROPONIN I   ____________________________________________  EKG  ED ECG REPORT I, Jene Every, the attending physician, personally viewed and interpreted this ECG.  Date: 08/30/2018  Rhythm: normal sinus rhythm QRS Axis: normal Intervals: normal ST/T Wave abnormalities: normal Narrative Interpretation: no evidence of acute ischemia  ____________________________________________  RADIOLOGY  Chest x-ray normal ____________________________________________   PROCEDURES  Procedure(s) performed: No  Procedures   Critical Care performed: No ____________________________________________   INITIAL IMPRESSION / ASSESSMENT AND PLAN / ED COURSE  Pertinent labs & imaging results that were available during my care of the patient were reviewed by me and considered in my medical decision making (see chart for details).  Patient well-appearing and in no acute distress.  His blood pressure is elevated but EKG is reassuring, lab work is unremarkable, normal troponin, normal chest x-ray.  Symptoms do not appear consistent with ACS.  Doubt pericarditis or myocarditis, no indication of ACS or PE.  Given that symptoms have resolved in the emergency department and work-up is overall reassuring.  Will discharge patient with outpatient follow-up with PCP and further evaluation    ____________________________________________   FINAL CLINICAL IMPRESSION(S)  / ED DIAGNOSES  Final diagnoses:  Atypical chest pain  Hypertension, unspecified type        Note:  This document was prepared using Dragon voice recognition software and may include unintentional dictation errors.   Jene Every, MD 08/30/18 2312

## 2018-08-30 NOTE — ED Triage Notes (Signed)
Pt reports that the last 2 weeks he has been having trouble with his BP being elevated - he states that BP elevates after he drinks alcohol and salty fish products - today he reports that he started with chest pain - he called PCP and they advised pt be seen in the ED - denies N/V and shortness of breath

## 2018-08-30 NOTE — ED Notes (Signed)
Esign not working pt verbalized discharge instructions and has no questions at this time 

## 2018-08-30 NOTE — Telephone Encounter (Signed)
Patient calls in to report he has had to leave work due to elevated bp, dizziness and L sided chest pain.  BP at his work was "somewhere around 160's/90's" He denies any shortness of breath, numbness/tingling or vision changes.  His dizziness and chest discomfort are better since leaving work but he is concerned and wondering what to do.  He does report a headache and states dizziness and chest pain have been intermittent over past 2 weeks but worse this am.  Due to symptoms, patient triaged to go to ER immediately for evaluation and stop and call 911 if symptoms are progressing.  He is driving and states that he is right near the [facility] hospital and will go in for eval as he feels he is okay to continue driving.   FYI to Dr. Sharen Hones

## 2018-09-03 ENCOUNTER — Telehealth: Payer: Self-pay

## 2018-09-03 ENCOUNTER — Ambulatory Visit: Payer: BLUE CROSS/BLUE SHIELD | Admitting: Family Medicine

## 2018-09-03 NOTE — Telephone Encounter (Signed)
Spoke w pt, encouraged cards f/u.

## 2018-09-03 NOTE — Telephone Encounter (Signed)
Pt was seen [facility] ED on 08/30/18; pt said testing done at ED was OK. Pt has CP on and off. Pt does not have CP now. Pt wanted to talk with Dr.Gutierrez today; pt did not want to schedule ED FU next wk because has cardiology appt on 09/09/18. Pt request cb from Dr Reece Agar today so can speak in spanish. I did not promise pt he would get a cb but that I would send the note. I advised pt if CP to go back to Ed. Pt voiced understanding.Misty Stanley CMA will let Dr Reece Agar be aware of this note.

## 2018-09-05 NOTE — Progress Notes (Deleted)
   There were no vitals taken for this visit.   CC: ER f/u visit Subjective:    Patient ID: Jesse Phillips, male    DOB: 1965-09-22, 53 y.o.   MRN: [id number]  HPI: Jesse Phillips is a 53 y.o. male presenting on 09/06/2018 for No chief complaint on file.    ***  Upcoming cardiology evaluation 09/09/2018.      Relevant past medical, surgical, family and social history reviewed and updated as indicated. Interim medical history since our last visit reviewed. Allergies and medications reviewed and updated. Outpatient Medications Prior to Visit  Medication Sig Dispense Refill  . Cholecalciferol (VITAMIN D) 2000 UNITS CAPS Take 1 capsule by mouth daily.    . Multiple Vitamins-Minerals (CENTRUM ADULTS PO) Take 1 tablet by mouth daily.     No facility-administered medications prior to visit.      Per HPI unless specifically indicated in ROS section below Review of Systems Objective:    There were no vitals taken for this visit.  Wt Readings from Last 3 Encounters:  08/30/18 210 lb (95.3 kg)  08/06/18 210 lb 8 oz (95.5 kg)  05/31/18 218 lb (98.9 kg)    Physical Exam    Results for orders placed or performed during the hospital encounter of 08/30/18  Basic metabolic panel  Result Value Ref Range   Sodium 136 135 - 145 mmol/L   Potassium 3.5 3.5 - 5.1 mmol/L   Chloride 103 98 - 111 mmol/L   CO2 24 22 - 32 mmol/L   Glucose, Bld 141 (H) 70 - 99 mg/dL   BUN 17 6 - 20 mg/dL   Creatinine, Ser 8.75 0.61 - 1.24 mg/dL   Calcium 9.7 8.9 - 64.3 mg/dL   GFR calc non Af Amer >60 >60 mL/min   GFR calc Af Amer >60 >60 mL/min   Anion gap 9 5 - 15  CBC  Result Value Ref Range   WBC 8.4 4.0 - 10.5 K/uL   RBC 5.88 (H) 4.22 - 5.81 MIL/uL   Hemoglobin 17.3 (H) 13.0 - 17.0 g/dL   HCT 32.9 51.8 - 84.1 %   MCV 88.3 80.0 - 100.0 fL   MCH 29.4 26.0 - 34.0 pg   MCHC 33.3 30.0 - 36.0 g/dL   RDW 66.0 63.0 - 16.0 %   Platelets 265 150 - 400 K/uL   nRBC 0.0 0.0 - 0.2 %  Troponin I - ONCE - STAT    Result Value Ref Range   Troponin I <0.03 <0.03 ng/mL   Assessment & Plan:   Problem List Items Addressed This Visit    None       No orders of the defined types were placed in this encounter.  No orders of the defined types were placed in this encounter.   Follow up plan: No follow-ups on file.  Eustaquio Boyden, MD

## 2018-09-06 ENCOUNTER — Ambulatory Visit: Payer: BLUE CROSS/BLUE SHIELD | Admitting: Family Medicine

## 2018-09-09 DIAGNOSIS — I10 Essential (primary) hypertension: Secondary | ICD-10-CM | POA: Diagnosis not present

## 2018-09-09 DIAGNOSIS — I208 Other forms of angina pectoris: Secondary | ICD-10-CM | POA: Diagnosis not present

## 2018-09-09 DIAGNOSIS — G4733 Obstructive sleep apnea (adult) (pediatric): Secondary | ICD-10-CM | POA: Diagnosis not present

## 2018-09-09 DIAGNOSIS — R011 Cardiac murmur, unspecified: Secondary | ICD-10-CM | POA: Diagnosis not present

## 2018-09-21 ENCOUNTER — Encounter: Payer: Self-pay | Admitting: Family Medicine

## 2018-09-21 ENCOUNTER — Emergency Department
Admission: EM | Admit: 2018-09-21 | Discharge: 2018-09-21 | Disposition: A | Discharge status: Home / Self Care | Payer: BLUE CROSS/BLUE SHIELD | Attending: Emergency Medicine | Admitting: Emergency Medicine

## 2018-09-21 ENCOUNTER — Encounter: Payer: Self-pay | Admitting: Emergency Medicine

## 2018-09-21 ENCOUNTER — Ambulatory Visit: Payer: BLUE CROSS/BLUE SHIELD | Admitting: Family Medicine

## 2018-09-21 ENCOUNTER — Emergency Department: Payer: BLUE CROSS/BLUE SHIELD

## 2018-09-21 ENCOUNTER — Other Ambulatory Visit: Payer: Self-pay

## 2018-09-21 VITALS — BP 150/98 | HR 96 | Temp 98.6°F | Ht 70.5 in | Wt 195.0 lb

## 2018-09-21 DIAGNOSIS — R079 Chest pain, unspecified: Secondary | ICD-10-CM | POA: Diagnosis not present

## 2018-09-21 DIAGNOSIS — R1011 Right upper quadrant pain: Secondary | ICD-10-CM | POA: Diagnosis not present

## 2018-09-21 DIAGNOSIS — M25512 Pain in left shoulder: Secondary | ICD-10-CM

## 2018-09-21 DIAGNOSIS — R030 Elevated blood-pressure reading, without diagnosis of hypertension: Secondary | ICD-10-CM | POA: Diagnosis not present

## 2018-09-21 DIAGNOSIS — F419 Anxiety disorder, unspecified: Secondary | ICD-10-CM | POA: Diagnosis not present

## 2018-09-21 DIAGNOSIS — Z79899 Other long term (current) drug therapy: Secondary | ICD-10-CM | POA: Diagnosis not present

## 2018-09-21 DIAGNOSIS — R748 Abnormal levels of other serum enzymes: Secondary | ICD-10-CM | POA: Diagnosis not present

## 2018-09-21 DIAGNOSIS — I10 Essential (primary) hypertension: Secondary | ICD-10-CM | POA: Diagnosis not present

## 2018-09-21 LAB — CBC WITH DIFFERENTIAL/PLATELET
Abs Immature Granulocytes: 0.02 10*3/uL (ref 0.00–0.07)
Basophils Absolute: 0 10*3/uL (ref 0.0–0.1)
Basophils Relative: 0 %
EOS PCT: 1 %
Eosinophils Absolute: 0.1 10*3/uL (ref 0.0–0.5)
HCT: 51.4 % (ref 39.0–52.0)
Hemoglobin: 17.1 g/dL — ABNORMAL HIGH (ref 13.0–17.0)
Immature Granulocytes: 0 %
Lymphocytes Relative: 37 %
Lymphs Abs: 3.2 10*3/uL (ref 0.7–4.0)
MCH: 29.4 pg (ref 26.0–34.0)
MCHC: 33.3 g/dL (ref 30.0–36.0)
MCV: 88.3 fL (ref 80.0–100.0)
Monocytes Absolute: 0.6 10*3/uL (ref 0.1–1.0)
Monocytes Relative: 7 %
Neutro Abs: 4.8 10*3/uL (ref 1.7–7.7)
Neutrophils Relative %: 55 %
PLATELETS: 263 10*3/uL (ref 150–400)
RBC: 5.82 MIL/uL — ABNORMAL HIGH (ref 4.22–5.81)
RDW: 12.9 % (ref 11.5–15.5)
WBC: 8.7 10*3/uL (ref 4.0–10.5)
nRBC: 0 % (ref 0.0–0.2)

## 2018-09-21 LAB — COMPREHENSIVE METABOLIC PANEL
ALT: 32 U/L (ref 0–44)
ANION GAP: 8 (ref 5–15)
AST: 30 U/L (ref 15–41)
Albumin: 4.8 g/dL (ref 3.5–5.0)
Alkaline Phosphatase: 46 U/L (ref 38–126)
BUN: 11 mg/dL (ref 6–20)
CO2: 27 mmol/L (ref 22–32)
Calcium: 9.5 mg/dL (ref 8.9–10.3)
Chloride: 100 mmol/L (ref 98–111)
Creatinine, Ser: 0.88 mg/dL (ref 0.61–1.24)
GFR calc non Af Amer: >60 mL/min (ref >60)
Glucose, Bld: 126 mg/dL — ABNORMAL HIGH (ref 70–99)
Potassium: 3.1 mmol/L — ABNORMAL LOW (ref 3.5–5.1)
Sodium: 135 mmol/L (ref 135–145)
Total Bilirubin: 2.2 mg/dL — ABNORMAL HIGH (ref 0.3–1.2)
Total Protein: 7.9 g/dL (ref 6.5–8.1)

## 2018-09-21 LAB — TROPONIN I: Troponin I: <0.03 ng/mL (ref <0.03)

## 2018-09-21 LAB — LIPASE, BLOOD: LIPASE: 27 U/L (ref 11–51)

## 2018-09-21 MED ORDER — POTASSIUM CHLORIDE CRYS ER 20 MEQ PO TBCR
40.0000 meq | EXTENDED_RELEASE_TABLET | Freq: Once | ORAL | Status: AC
  Administered 2018-09-21: 40 meq via ORAL
  Filled 2018-09-21: qty 2

## 2018-09-21 MED ORDER — LORAZEPAM 0.5 MG PO TABS: 0.5000 mg | ORAL_TABLET | Freq: Two times a day (BID) | ORAL | 0 refills | Status: DC | PRN

## 2018-09-21 MED ORDER — CITALOPRAM HYDROBROMIDE 10 MG PO TABS: 10.0000 mg | ORAL_TABLET | Freq: Every day | ORAL | 3 refills | Status: DC

## 2018-09-21 NOTE — Patient Instructions (Addendum)
Pare hydrochlorothiazide por ahora.  Siga losartan 50mg  diarios para presion arterial.  Para ansiedad - empieze celexa (citalopram) 10 mg diarios - esto toma 3-4 semanas para tomar efecto. Puede usar medicina ativan (lorazepam) 1/2-1 pastilla cuando necesite para stress/ansiedad.  Regresar en 1 mes para proximo examen.  Para hombro - de pronto tiene tendonitis de musculo biceps. haga ejercicios que le voy a dar hoy, trate paos de agua caliente. puede usar ibuprofeno para hombro.

## 2018-09-21 NOTE — ED Triage Notes (Signed)
Patient ambulatory to triage with steady gait, without difficulty or distress noted; pt reports left sided CP radiating into arm accomp by HA & dizziness

## 2018-09-21 NOTE — Progress Notes (Signed)
BP (!) 150/98 (BP Location: Right Arm, Patient Position: Sitting, Cuff Size: Normal)   Pulse 96   Temp 98.6 F (37 C) (Oral)   Ht 5' 10.5" (1.791 m)   Wt 195 lb (88.5 kg)   SpO2 100%   BMI 27.58 kg/m   Forgot to recheck BP  CC: ER f/u visit Subjective:    Patient ID: Jesse Phillips, male    DOB: [birth year], 53 y.o.   MRN: [id number]  HPI: Jesse Phenixrmando Simoneaux is a 53 y.o. male presenting on 09/21/2018 for Hospitalization Follow-up (Seen at [facility] ED today. Pt brought in home BP monitor to compare. His monitor read 145/80.) On recheck again, 135 systolic.   Recent ER visits x2 this past month for chest pain. Initial visit 08/30/2018 - atypical chest pain, rule out CE with normal EKG and CXR. Referred to cardiology - saw Dr Alan Ripperaldwood who recommended echo for evaluation of murmur as well as lexiscan myoview for further assessment. Upcoming appt next Friday.   Seen again this morning at ER with recurrent L sided chest pain with palpitations. Records reviewed - note not up yet but reassuring eval with normal TnI x2, CXR, abd US (fatty liver), and other labs. EKG showed new right axis deviation. He feels some anxiety is driving L shoulder. Also notes L shoulder pain with certain movements ie sleeping at night - ?MSK cause. Pt states BP 160 systolic when he arrived to ER, on discharge BP 120/70s.   Finds difficulty relaxing. Significant ongoing anxiety. Ongoing work stress, family stress, some marital stress.  No depression, SI/HI.   Known OSA on CPAP.  Recent trip to [place] this past weekend - stressful ride up to cabin.   15 lb weight loss this past month - attributes to healthy diet changes.      Relevant past medical, surgical, family and social history reviewed and updated as indicated. Interim medical history since our last visit reviewed. Allergies and medications reviewed and updated. Outpatient Medications Prior to Visit  Medication Sig Dispense Refill  . Cholecalciferol (VITAMIN D)  2000 UNITS CAPS Take 1 capsule by mouth daily.    Marland Kitchen. losartan (COZAAR) 50 MG tablet Take 1 tablet by mouth daily.    . Multiple Vitamins-Minerals (CENTRUM ADULTS PO) Take 1 tablet by mouth daily.    . hydrochlorothiazide (HYDRODIURIL) 12.5 MG tablet Take 1 tablet by mouth daily.     No facility-administered medications prior to visit.      Per HPI unless specifically indicated in ROS section below Review of Systems Objective:    BP (!) 150/98 (BP Location: Right Arm, Patient Position: Sitting, Cuff Size: Normal)   Pulse 96   Temp 98.6 F (37 C) (Oral)   Ht 5' 10.5" (1.791 m)   Wt 195 lb (88.5 kg)   SpO2 100%   BMI 27.58 kg/m   Wt Readings from Last 3 Encounters:  09/21/18 195 lb (88.5 kg)  09/21/18 202 lb (91.6 kg)  08/30/18 210 lb (95.3 kg)    Physical Exam Vitals signs and nursing note reviewed.  Constitutional:      General: He is not in acute distress.    Appearance: Normal appearance.  HENT:     Head: Normocephalic and atraumatic.  Cardiovascular:     Rate and Rhythm: Normal rate and regular rhythm.     Pulses: Normal pulses.     Heart sounds: Murmur (faint systolic) present.  Pulmonary:     Effort: Pulmonary effort is normal. No respiratory distress.  Breath sounds: Normal breath sounds. No wheezing, rhonchi or rales.  Musculoskeletal: Normal range of motion.     Comments: FROM at neck R shoulder WNL L shoulder exam:  No deformity of shoulders on inspection. Some discomfort to palpation of lateral shoulder at deltoid. FROM in abduction and forward flexion. No pain or weakness with testing SITS in ext/int rotation. No pain with empty can sign. Discomfort with Speed test. Pain with impingement. No pain with rotation of humeral head in [facility] joint.   Skin:    General: Skin is warm and dry.     Findings: No rash.  Neurological:     Mental Status: He is alert.  Psychiatric:        Attention and Perception: Attention normal.        Mood and Affect: Mood is  anxious.        Behavior: Behavior normal.       Results for orders placed or performed during the hospital encounter of 09/21/18  CBC with Differential  Result Value Ref Range   WBC 8.7 4.0 - 10.5 K/uL   RBC 5.82 (H) 4.22 - 5.81 MIL/uL   Hemoglobin 17.1 (H) 13.0 - 17.0 g/dL   HCT 16.1 09.6 - 04.5 %   MCV 88.3 80.0 - 100.0 fL   MCH 29.4 26.0 - 34.0 pg   MCHC 33.3 30.0 - 36.0 g/dL   RDW 40.9 81.1 - 91.4 %   Platelets 263 150 - 400 K/uL   nRBC 0.0 0.0 - 0.2 %   Neutrophils Relative % 55 %   Neutro Abs 4.8 1.7 - 7.7 K/uL   Lymphocytes Relative 37 %   Lymphs Abs 3.2 0.7 - 4.0 K/uL   Monocytes Relative 7 %   Monocytes Absolute 0.6 0.1 - 1.0 K/uL   Eosinophils Relative 1 %   Eosinophils Absolute 0.1 0.0 - 0.5 K/uL   Basophils Relative 0 %   Basophils Absolute 0.0 0.0 - 0.1 K/uL   Immature Granulocytes 0 %   Abs Immature Granulocytes 0.02 0.00 - 0.07 K/uL  Comprehensive metabolic panel  Result Value Ref Range   Sodium 135 135 - 145 mmol/L   Potassium 3.1 (L) 3.5 - 5.1 mmol/L   Chloride 100 98 - 111 mmol/L   CO2 27 22 - 32 mmol/L   Glucose, Bld 126 (H) 70 - 99 mg/dL   BUN 11 6 - 20 mg/dL   Creatinine, Ser 7.82 0.61 - 1.24 mg/dL   Calcium 9.5 8.9 - 95.6 mg/dL   Total Protein 7.9 6.5 - 8.1 g/dL   Albumin 4.8 3.5 - 5.0 g/dL   AST 30 15 - 41 U/L   ALT 32 0 - 44 U/L   Alkaline Phosphatase 46 38 - 126 U/L   Total Bilirubin 2.2 (H) 0.3 - 1.2 mg/dL   GFR calc non Af Amer >60 >60 mL/min   GFR calc Af Amer >60 >60 mL/min   Anion gap 8 5 - 15  Troponin I - ONCE - STAT  Result Value Ref Range   Troponin I <0.03 <0.03 ng/mL  Lipase, blood  Result Value Ref Range   Lipase 27 11 - 51 U/L  Troponin I - ONCE - STAT  Result Value Ref Range   Troponin I <0.03 <0.03 ng/mL   Lab Results  Component Value Date   CHOL 246 (A) 07/08/2018   HDL 53 07/08/2018   LDLCALC 171 07/08/2018   TRIG 111 07/08/2018   Lab Results  Component Value  Date   HGBA1C 5.6 07/08/2018   Depression  screen [facility] 2/9 09/21/2018 08/06/2018 05/31/2018 07/30/2017  Decreased Interest 0 0 0 0  Down, Depressed, Hopeless 1 0 0 0  PHQ - 2 Score 1 0 0 0  Altered sleeping 1 - - -  Tired, decreased energy 1 - - -  Change in appetite 0 - - -  Feeling bad or failure about yourself  1 - - -  Trouble concentrating 1 - - -  Moving slowly or fidgety/restless 1 - - -  Suicidal thoughts 0 - - -  PHQ-9 Score 6 - - -    GAD 7 : Generalized Anxiety Score 09/21/2018  Nervous, Anxious, on Edge 1  Control/stop worrying 1  Worry too much - different things 1  Trouble relaxing 1  Restless 1  Easily annoyed or irritable 0  Afraid - awful might happen 2  Total GAD 7 Score 7     Assessment & Plan:   Problem List Items Addressed This Visit    White coat syndrome with high blood pressure without hypertension    Losartan and hctz were started by cardiology - however endorses trouble tolerating both meds, recent labs showing hypokalemia - will stop HCTZ for now, continue losartan. Reassess at 1 mo f/u visit.       Relevant Medications   losartan (COZAAR) 50 MG tablet   Left shoulder pain    Exam suspicious for biceps tendonitis - rec heating pad, OTC ibuprofen PRN, and provided with stretching exercises from [facility] pt advisor.       Chest pain - Primary    Sounds non-cardiac - did encourage he complete planned stress test next week.       Anxiety    Endorses significant ongoing anxiety that may be contributing to above symptoms. Discussed treatment options including daily anxiety medication vs PRN benzo. Will start celexa 10mg  daily, with start 1/2-1 tab ativan PRN anxiety, discussed temporary course, habit forming nature of benzo, reviewed common side effects of SSRI.  PHQ9 and GAD7 reviewed.  RTC 1 mo f/u visit.  Encouraged he also consider direction at his local church.       Relevant Medications   citalopram (CELEXA) 10 MG tablet   LORazepam (ATIVAN) 0.5 MG tablet       Meds ordered this encounter    Medications  . citalopram (CELEXA) 10 MG tablet    Sig: Take 1 tablet (10 mg total) by mouth daily.    Dispense:  30 tablet    Refill:  3  . LORazepam (ATIVAN) 0.5 MG tablet    Sig: Take 1 tablet (0.5 mg total) by mouth 2 (two) times daily as needed for anxiety.    Dispense:  20 tablet    Refill:  0   No orders of the defined types were placed in this encounter.   Patient Instructions  Pare hydrochlorothiazide por ahora.  Siga losartan 50mg  diarios para presion arterial.  Para ansiedad - empieze celexa (citalopram) 10 mg diarios - esto toma 3-4 semanas para tomar efecto. Puede usar medicina ativan (lorazepam) 1/2-1 pastilla cuando necesite para stress/ansiedad.  Regresar en 1 mes para proximo examen.  Para hombro - de pronto tiene tendonitis de musculo biceps. haga ejercicios que le voy a dar hoy, trate paos de agua caliente. puede usar ibuprofeno para hombro.    Follow up plan: Return in about 4 weeks (around 10/19/2018) for follow up visit.  Eustaquio Boyden, MD

## 2018-09-23 DIAGNOSIS — F419 Anxiety disorder, unspecified: Secondary | ICD-10-CM | POA: Insufficient documentation

## 2018-09-23 DIAGNOSIS — M25512 Pain in left shoulder: Secondary | ICD-10-CM | POA: Insufficient documentation

## 2018-09-23 DIAGNOSIS — R079 Chest pain, unspecified: Secondary | ICD-10-CM | POA: Insufficient documentation

## 2018-09-23 NOTE — Assessment & Plan Note (Signed)
Exam suspicious for biceps tendonitis - rec heating pad, OTC ibuprofen PRN, and provided with stretching exercises from [facility] pt advisor.

## 2018-09-23 NOTE — Assessment & Plan Note (Signed)
Sounds non-cardiac - did encourage he complete planned stress test next week.

## 2018-09-23 NOTE — ED Provider Notes (Signed)
[facility] Emergency Department Provider Note ___   First MD Initiated Contact with Patient 09/21/18 [phone]     (approximate)  I have reviewed the triage vital signs and the nursing notes.   HISTORY  Chief Complaint Chest Pain   HPI Jesse Phillips is a 53 y.o. male with below list of chronic medical conditions presents to the emergency department with epigastric/left chest discomfort with associated nausea occasional dizziness.  Patient denies any dyspnea.  Patient denies any fever.  Patient states that his abdomen feels bloated.  He denies any diarrhea or constipation.  Patient denies any lower extremity pain or swelling.   Past Medical History:  Diagnosis Date  . HLD (hyperlipidemia)   . HTN (hypertension)   . OSA on CPAP    compliant  . Prediabetes   . Vitamin D deficiency     Patient Active Problem List   Diagnosis Date Noted  . Cardiac murmur 07/30/2017  . 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 Health maintenance examination 04/11/2015  . Overweight (BMI 25.0-29.9) 10/12/2014  . HLD (hyperlipidemia)   . OSA on CPAP   . Prediabetes   . Vitamin D deficiency     Past Surgical History:  Procedure Laterality Date  . NO PAST SURGERIES      Prior to Admission medications   Medication Sig Start Date End Date Taking? Authorizing Provider  Cholecalciferol (VITAMIN D) 2000 UNITS CAPS Take 1 capsule by mouth daily.    [provider]  citalopram (CELEXA) 10 MG tablet Take 1 tablet (10 mg total) by mouth daily. 09/21/18   Eustaquio BoydenGutierrez, Javier, MD  LORazepam (ATIVAN) 0.5 MG tablet Take 1 tablet (0.5 mg total) by mouth 2 (two) times daily as needed for anxiety. 09/21/18   Eustaquio BoydenGutierrez, Javier, MD  losartan (COZAAR) 50 MG tablet Take 1 tablet by mouth daily. 09/09/18 09/09/19  [provider]  Multiple Vitamins-Minerals (CENTRUM ADULTS PO) Take 1 tablet by mouth daily.    [provider]     Allergies Patient has no known allergies.  Family History  Problem Relation Age of Onset  . Hypertension Father   . Hyperlipidemia Father   . Cancer Brother [id number]       prostate  . Depression Brother        deceased (homosexual)  . Deep vein thrombosis Brother        deceased  . Diabetes Neg Hx   . CAD Neg Hx   . Stroke Neg Hx     Social History Social History   Tobacco Use  . Smoking status: Never Smoker  . Smokeless tobacco: Never Used  Substance Use Topics  . Alcohol use: Yes    Alcohol/week: 1.0 standard drinks    Types: 1 Cans of beer per week    Comment: 1 beer every day and 8 beers a day on weekend  . Drug use: No    Review of Systems Constitutional: No fever/chills Eyes: No visual changes. ENT: No sore throat. Cardiovascular: Positive for chest pain. Respiratory: Denies shortness of breath. Gastrointestinal: As before abdominal pain.  No nausea, no vomiting.  No diarrhea.  No constipation. Genitourinary: Negative for dysuria. Musculoskeletal: Negative for neck pain.  Negative for back pain. Integumentary: Negative for rash. Neurological: Negative for headaches, focal weakness or numbness.   ____________________________________________   PHYSICAL EXAM:  VITAL SIGNS: ED Triage Vitals  Enc Vitals Group     BP 09/21/18 0023 (!) 161/85  Pulse Rate 09/21/18 0400 84     Resp 09/21/18 0023 18     Temp 09/21/18 0023 97.9 F (36.6 C)     Temp Source 09/21/18 0023 Oral     SpO2 09/21/18 0400 99 %     Weight 09/21/18 0022 91.6 kg (202 lb)     Height 09/21/18 0022 1.803 m (5\' 11" )     Head Circumference --      Peak Flow --      Pain Score 09/21/18 0022 8     Pain Loc --      Pain Edu? --      Excl. in GC? --     Constitutional: Alert and oriented. Well appearing and in no acute distress. Eyes: Conjunctivae are normal.  Mouth/Throat: Mucous membranes are moist.  Oropharynx non-erythematous. Neck: No stridor.   Cardiovascular: Normal rate,  regular rhythm. Good peripheral circulation. Grossly normal heart sounds. Respiratory: Normal respiratory effort.  No retractions. Lungs CTAB. Gastrointestinal: Epigastric tenderness with palpation.. No distention.  Musculoskeletal: No lower extremity tenderness nor edema. No gross deformities of extremities. Neurologic:  Normal speech and language. No gross focal neurologic deficits are appreciated.  Skin:  Skin is warm, dry and intact. No rash noted.  ____________________________________________   LABS (all labs ordered are listed, but only abnormal results are displayed)  Labs Reviewed  CBC WITH DIFFERENTIAL/PLATELET - Abnormal; Notable for the following components:      Result Value   RBC 5.82 (*)    Hemoglobin 17.1 (*)    All other components within normal limits  COMPREHENSIVE METABOLIC PANEL - Abnormal; Notable for the following components:   Potassium 3.1 (*)    Glucose, Bld 126 (*)    Total Bilirubin 2.2 (*)    All other components within normal limits  TROPONIN I  LIPASE, BLOOD  TROPONIN I   ____________________________________________  EKG  ED ECG REPORT I, [place] N Kyanne Rials, the attending physician, personally viewed and interpreted this ECG.   Date: 09/23/2018  EKG Time:   Rate: 83  Rhythm: Normal sinus rhythm  Axis: Normal  Intervals: Normal  ST&T Change: None  ____________________________________________  RADIOLOGY I, [place] N Zyan Mirkin, personally viewed and evaluated these images (plain radiographs) as part of my medical decision making, as well as reviewing the written report by the radiologist.  ED MD interpretation: No acute chest findings on chest x-ray per radiologist  Official radiology report(s): No results found.   Procedures   ____________________________________________   INITIAL IMPRESSION / ASSESSMENT AND PLAN / ED COURSE  As part of my medical decision making, I reviewed the following data within the electronic MEDICAL RECORD NUMBER   53 year old male presenting with above-stated history and physical exam secondary to chest discomfort and epigastric pain.  Considered possibility of cholelithiasis versus cholecystitis and as such ultrasound performed which was negative also considered possibly of CAD and as such EKG performed which revealed no evidence of ischemia or infarction.  Laboratory data including troponin x2-.  No clear etiology for the patient's discomfort identified patient referred to primary care provider for further outpatient evaluation.  Of note patient has an upcoming appointment with cardiology which I encouraged him to keep.  ______________  FINAL CLINICAL IMPRESSION(S) / ED DIAGNOSES  Final diagnoses:  RUQ pain  Chest pain, unspecified type     MEDICATIONS GIVEN DURING THIS VISIT:  Medications  potassium chloride SA (K-DUR,KLOR-CON) CR tablet 40 mEq (40 mEq Oral Given 09/21/18 0545)     ED Discharge Orders  None       Note:  This document was prepared using Dragon voice recognition software and may include unintentional dictation errors.    Darci CurrentBrown, [place] N, MD 09/23/18 [phone]

## 2018-09-24 NOTE — Assessment & Plan Note (Signed)
Losartan and hctz were started by cardiology - however endorses trouble tolerating both meds, recent labs showing hypokalemia - will stop HCTZ for now, continue losartan. Reassess at 1 mo f/u visit.

## 2018-09-24 NOTE — Assessment & Plan Note (Addendum)
Endorses significant ongoing anxiety that may be contributing to above symptoms. Discussed treatment options including daily anxiety medication vs PRN benzo. Will start celexa 10mg  daily, with start 1/2-1 tab ativan PRN anxiety, discussed temporary course, habit forming nature of benzo, reviewed common side effects of SSRI.  PHQ9 and GAD7 reviewed.  RTC 1 mo f/u visit.  Encouraged he also consider direction at his local church.

## 2018-10-01 DIAGNOSIS — I208 Other forms of angina pectoris: Secondary | ICD-10-CM | POA: Diagnosis not present

## 2018-10-22 ENCOUNTER — Encounter: Payer: Self-pay | Admitting: Family Medicine

## 2018-10-22 ENCOUNTER — Ambulatory Visit: Payer: BLUE CROSS/BLUE SHIELD | Admitting: Family Medicine

## 2018-10-22 VITALS — BP 132/84 | HR 88 | Temp 98.4°F | Ht 70.5 in | Wt 195.2 lb

## 2018-10-22 DIAGNOSIS — R011 Cardiac murmur, unspecified: Secondary | ICD-10-CM

## 2018-10-22 DIAGNOSIS — F419 Anxiety disorder, unspecified: Secondary | ICD-10-CM | POA: Diagnosis not present

## 2018-10-22 DIAGNOSIS — M25512 Pain in left shoulder: Secondary | ICD-10-CM

## 2018-10-22 DIAGNOSIS — I10 Essential (primary) hypertension: Secondary | ICD-10-CM | POA: Diagnosis not present

## 2018-10-22 MED ORDER — LOSARTAN POTASSIUM 25 MG PO TABS: 25.0000 mg | ORAL_TABLET | Freq: Every day | ORAL | 11 refills | Status: DC

## 2018-10-22 MED ORDER — CITALOPRAM HYDROBROMIDE 10 MG PO TABS: 10.0000 mg | ORAL_TABLET | Freq: Every day | ORAL | 6 refills | Status: DC

## 2018-10-22 MED ORDER — LORAZEPAM 0.5 MG PO TABS: 0.5000 mg | ORAL_TABLET | Freq: Two times a day (BID) | ORAL | 0 refills | Status: DC | PRN

## 2018-10-22 NOTE — Assessment & Plan Note (Addendum)
Found to have shoulder and chest tendonitis, improved with PT through work.

## 2018-10-22 NOTE — Assessment & Plan Note (Signed)
He is only taking losartan 25mg  daily. Work readings actually well controlled despite elevated readings in office today. Will continue current regimen, refilled 25mg  dose.

## 2018-10-22 NOTE — Assessment & Plan Note (Signed)
Ongoing anxiety with attacks. Advised restart celexa 10mg  daily for 3 mo then reassess at f/u visit. Lorazepam #20 refilled today.

## 2018-10-22 NOTE — Patient Instructions (Addendum)
siga losartan 25mg  diarios. siga celexa 10mg  diarios por los proximos meses regresar en 3 meses para proxima visita.

## 2018-10-22 NOTE — Progress Notes (Signed)
BP 132/84 Comment: at home  Pulse 88   Temp 98.4 F (36.9 C) (Oral)   Ht 5' 10.5" (1.791 m)   Wt 195 lb 3 oz (88.5 kg)   SpO2 98%   BMI 27.61 kg/m    CC: 1 mo f/u visit Subjective:    Patient ID: Jesse Phillips, male    DOB: Feb 26, 1966, 53 y.o.   MRN: [id number]  HPI: Jesse Phillips is a 53 y.o. male presenting on 10/22/2018 for Follow-up (Here for 1 mo f/u.)   See prior note for details.   Endorses white coat hypertension. Brings BP log 120-136/80s. Only taking losartan  daily.   Anxiety - only took celexa and lorazepam prn for a few weeks. Has had 2 anxiety attacks this week.   He had reassuring echocardiogram and stress myoview 09/2018 that were both normal through [facility] cardiology [facility]). I reviewed both.  Treated for tendonitis at work with improvement in chest pain.   Saw eye doctor, prescribed bifocals.      Relevant past medical, surgical, family and social history reviewed and updated as indicated. Interim medical history since our last visit reviewed. Allergies and medications reviewed and updated. Outpatient Medications Prior to Visit  Medication Sig Dispense Refill  . Cholecalciferol (VITAMIN D) 2000 UNITS CAPS Take 1 capsule by mouth daily.    . Multiple Vitamins-Minerals (CENTRUM ADULTS PO) Take 1 tablet by mouth daily.    . citalopram (CELEXA) 10 MG tablet Take 1 tablet (10 mg total) by mouth daily. 30 tablet 3  . LORazepam (ATIVAN) 0.5 MG tablet Take 1 tablet (0.5 mg total) by mouth 2 (two) times daily as needed for anxiety. 20 tablet 0  . losartan (COZAAR) 50 MG tablet Take 1 tablet by mouth daily.     No facility-administered medications prior to visit.      Per HPI unless specifically indicated in ROS section below Review of Systems Objective:    BP 132/84 Comment: at home  Pulse 88   Temp 98.4 F (36.9 C) (Oral)   Ht 5' 10.5" (1.791 m)   Wt 195 lb 3 oz (88.5 kg)   SpO2 98%   BMI 27.61 kg/m   Wt Readings from Last 3 Encounters:    10/22/18 195 lb 3 oz (88.5 kg)  09/21/18 195 lb (88.5 kg)  09/21/18 202 lb (91.6 kg)    Physical Exam Vitals signs and nursing note reviewed.  Constitutional:      Appearance: Normal appearance. He is not ill-appearing.  HENT:     Mouth/Throat:     Mouth: Mucous membranes are moist.  Cardiovascular:     Rate and Rhythm: Normal rate and regular rhythm.     Pulses: Normal pulses.     Heart sounds: Normal heart sounds. No murmur.  Pulmonary:     Effort: Pulmonary effort is normal. No respiratory distress.     Breath sounds: Normal breath sounds. No wheezing, rhonchi or rales.  Musculoskeletal:     Right lower leg: No edema.     Left lower leg: No edema.  Neurological:     Mental Status: He is alert.  Psychiatric:        Mood and Affect: Mood normal.        Behavior: Behavior normal.       Results for orders placed or performed during the hospital encounter of 09/21/18  CBC with Differential  Result Value Ref Range   WBC 8.7 4.0 - 10.5 K/uL   RBC 5.82 (H)  4.22 - 5.81 MIL/uL   Hemoglobin 17.1 (H) 13.0 - 17.0 g/dL   HCT 18.5 90.9 - 31.1 %   MCV 88.3 80.0 - 100.0 fL   MCH 29.4 26.0 - 34.0 pg   MCHC 33.3 30.0 - 36.0 g/dL   RDW 21.6 24.4 - 69.5 %   Platelets 263 150 - 400 K/uL   nRBC 0.0 0.0 - 0.2 %   Neutrophils Relative % 55 %   Neutro Abs 4.8 1.7 - 7.7 K/uL   Lymphocytes Relative 37 %   Lymphs Abs 3.2 0.7 - 4.0 K/uL   Monocytes Relative 7 %   Monocytes Absolute 0.6 0.1 - 1.0 K/uL   Eosinophils Relative 1 %   Eosinophils Absolute 0.1 0.0 - 0.5 K/uL   Basophils Relative 0 %   Basophils Absolute 0.0 0.0 - 0.1 K/uL   Immature Granulocytes 0 %   Abs Immature Granulocytes 0.02 0.00 - 0.07 K/uL  Comprehensive metabolic panel  Result Value Ref Range   Sodium 135 135 - 145 mmol/L   Potassium 3.1 (L) 3.5 - 5.1 mmol/L   Chloride 100 98 - 111 mmol/L   CO2 27 22 - 32 mmol/L   Glucose, Bld 126 (H) 70 - 99 mg/dL   BUN 11 6 - 20 mg/dL   Creatinine, Ser 0.72 0.61 - 1.24 mg/dL    Calcium 9.5 8.9 - 25.7 mg/dL   Total Protein 7.9 6.5 - 8.1 g/dL   Albumin 4.8 3.5 - 5.0 g/dL   AST 30 15 - 41 U/L   ALT 32 0 - 44 U/L   Alkaline Phosphatase 46 38 - 126 U/L   Total Bilirubin 2.2 (H) 0.3 - 1.2 mg/dL   GFR calc non Af Amer >60 >60 mL/min   GFR calc Af Amer >60 >60 mL/min   Anion gap 8 5 - 15  Troponin I - ONCE - STAT  Result Value Ref Range   Troponin I <0.03 <0.03 ng/mL  Lipase, blood  Result Value Ref Range   Lipase 27 11 - 51 U/L  Troponin I - ONCE - STAT  Result Value Ref Range   Troponin I <0.03 <0.03 ng/mL   Lab Results  Component Value Date   CHOL 246 (A) 07/08/2018   HDL 53 07/08/2018   LDLCALC 171 07/08/2018   TRIG 111 07/08/2018    ECHOCARDOIGRAM: INTERPRETATION  NORMAL LEFT VENTRICULAR SYSTOLIC FUNCTION WITH AN ESTIMATED EF = >55 %  NORMAL RIGHT VENTRICULAR SYSTOLIC FUNCTION  TRACE TRICUSPID, MITRAL AND AORTIC VALVE INSUFFICIENCY  NO VALVULAR STENOSIS   STRESS MYOVIEW: LVEF= 70 %  FINDINGS:  Regional wall motion: reveals normal myocardial thickening and wall motion. The overall quality of the study is good.   Artifacts noted: no  Left ventricular cavity: normal.  Perfusion Analysis: SPECT images demonstrate homogeneous tracer distribution throughout the myocardium.  Assessment & Plan:   Problem List Items Addressed This Visit    White coat syndrome with hypertension - Primary    He is only taking losartan 25mg  daily. Work readings actually well controlled despite elevated readings in office today. Will continue current regimen, refilled 25mg  dose.       Relevant Medications   losartan (COZAAR) 25 MG tablet   RESOLVED: Left shoulder pain    Found to have shoulder and chest tendonitis, improved with PT through work.       Cardiac murmur    Not appreciate today. Echo with trace valvular insufficiency       Anxiety  Ongoing anxiety with attacks. Advised restart celexa 10mg  daily for 3 mo then reassess at f/u visit.  Lorazepam #20 refilled today.       Relevant Medications   citalopram (CELEXA) 10 MG tablet   LORazepam (ATIVAN) 0.5 MG tablet       Meds ordered this encounter  Medications  . losartan (COZAAR) 25 MG tablet    Sig: Take 1 tablet (25 mg total) by mouth daily.    Dispense:  30 tablet    Refill:  11  . citalopram (CELEXA) 10 MG tablet    Sig: Take 1 tablet (10 mg total) by mouth daily.    Dispense:  30 tablet    Refill:  6  . LORazepam (ATIVAN) 0.5 MG tablet    Sig: Take 1 tablet (0.5 mg total) by mouth 2 (two) times daily as needed for anxiety.    Dispense:  20 tablet    Refill:  0   No orders of the defined types were placed in this encounter.   Follow up plan: Return in about 3 months (around 01/20/2019) for follow up visit.  Eustaquio Boyden, MD

## 2018-10-22 NOTE — Assessment & Plan Note (Signed)
Not appreciate today. Echo with trace valvular insufficiency

## 2018-10-27 ENCOUNTER — Telehealth: Payer: Self-pay

## 2018-10-27 DIAGNOSIS — G4733 Obstructive sleep apnea (adult) (pediatric): Secondary | ICD-10-CM | POA: Diagnosis not present

## 2018-10-27 NOTE — Telephone Encounter (Signed)
We need to give celexa a full chance to work - he just restarted it regularly last week. It can take 3-4 weeks to achieve full effect. Recommend he call us in 3 weeks with an update - if ongoing trouble noted at work, then we can increase to 20mg  dose.

## 2018-10-27 NOTE — Telephone Encounter (Signed)
Pt left v/m;pt was seen on 10/22/18 and pts employer wants to know if pt could be increased to citalopram 20 mg due to stress level of pt. Next appt scheduled on 01/21/19. Please advise. [facility] [address]

## 2018-10-28 NOTE — Telephone Encounter (Signed)
Attempted to contact pt.  No answer.  No vm.  Need to relay Dr. G's message.  

## 2018-10-29 NOTE — Telephone Encounter (Signed)
Attempted to contact pt.  No answer.  No vm.  Need to relay Dr. G's message.  

## 2018-11-01 NOTE — Telephone Encounter (Signed)
Attempted to contact pt.  No answer.  No vm.  Need to relay Dr. G's message. Mailing a letter.  

## 2018-11-10 MED ORDER — CITALOPRAM HYDROBROMIDE 20 MG PO TABS: 20.0000 mg | ORAL_TABLET | Freq: Every day | ORAL | 6 refills | Status: DC

## 2018-11-10 MED ORDER — LORAZEPAM 0.5 MG PO TABS: 0.5000 mg | ORAL_TABLET | Freq: Two times a day (BID) | ORAL | 0 refills | Status: DC | PRN

## 2018-11-10 NOTE — Telephone Encounter (Signed)
Agree - let's continue higher celexa dose. I've sent in 20mg  dose to pharmacy, and have refilled lorazepam. Update [place] with effect in 2-3 weeks.

## 2018-11-10 NOTE — Telephone Encounter (Signed)
Pt left v/m requesting cb about increasing citalopram; pt tried increasing citalopram for 3 days and it worked well for pt. Pt has too much stress.pt is out of citalopram and lorazepam. Pt request cb.

## 2018-11-10 NOTE — Addendum Note (Signed)
Addended by: Eustaquio Boyden on: 11/10/2018 12:38 PM   Modules accepted: Orders

## 2018-11-10 NOTE — Telephone Encounter (Signed)
Spoke with pt relaying Dr. G's message and instructions.  Pt verbalizes understanding. 

## 2018-11-17 DIAGNOSIS — D485 Neoplasm of uncertain behavior of skin: Secondary | ICD-10-CM | POA: Diagnosis not present

## 2018-12-21 ENCOUNTER — Encounter: Payer: Self-pay | Admitting: Family Medicine

## 2018-12-21 ENCOUNTER — Ambulatory Visit (INDEPENDENT_AMBULATORY_CARE_PROVIDER_SITE_OTHER): Payer: BLUE CROSS/BLUE SHIELD | Admitting: Family Medicine

## 2018-12-21 VITALS — Ht 70.5 in

## 2018-12-21 DIAGNOSIS — F419 Anxiety disorder, unspecified: Secondary | ICD-10-CM

## 2018-12-21 MED ORDER — VENLAFAXINE HCL ER 75 MG PO CP24: 75.0000 mg | ORAL_CAPSULE | Freq: Every day | ORAL | 3 refills | Status: DC

## 2018-12-21 MED ORDER — LORAZEPAM 0.5 MG PO TABS: 0.5000 mg | ORAL_TABLET | Freq: Two times a day (BID) | ORAL | 0 refills | Status: DC | PRN

## 2018-12-21 NOTE — Assessment & Plan Note (Signed)
No improvement after 2 months of celexa, 1 month at 20mg  dose. Ongoing anxiety described as palpitations, neck pain, shortness of breath. Will cross taper off celexa and onto effexor XR 75mg  daily. Discussed cut celexa in half for 1 wk then stop, start effexor 3d into taper. Update with effect in 1-2 months, sooner if no improvement or worsening. Reviewed elevated BP risk of effexor and to monitor blood pressures regularly with new medication.

## 2018-12-21 NOTE — Progress Notes (Signed)
Virtual visit completed through [employer]. Due to national recommendations of social distancing due to COVID 19, a virtual visit is felt to be most appropriate for this patient at this time.   Patient location: at work office Provider [facility] If any vitals were documented, they were collected by patient at home unless specified below.    Ht 5' 10.5" (1.791 m)   BMI 27.61 kg/m    CC: anxiety f/u Subjective:    Patient ID: Jesse Phillips, male    DOB: 28-Apr-1966, 53 y.o.   MRN: [id number]  HPI: Jesse Phillips is a 53 y.o. male presenting on 12/21/2018 for Anxiety (Wants to discuss anxiety meds. )   See prior note for details.  Seen here earlier in the year with chest pain associated with anxiety s/p cardiology evaluation with reassuring echocardiogram and stress myoview (09/2018 by Dr Juliann Paresallwood). He was also treated for chest wall tendonitis with improvement in symptoms. At that time, we treated anxiety with attacks by starting celexa 10mg  daily with lorazepam PRN anxiety attack.   Notes ongoing stress worse in the mornings while at work. Some neck discomfort, some shortness of breath. Sleeping well with CPAP. He does find exercise helps.   Itchy rash to body - but more likely due to dry environment at work.   Has run out of lorazepam - may have lost effect.   BP running well controlled on losartan 25mg  daily, but notes elevated readings when increased stress levels.       Relevant past medical, surgical, family and social history reviewed and updated as indicated. Interim medical history since our last visit reviewed. Allergies and medications reviewed and updated. Outpatient Medications Prior to Visit  Medication Sig Dispense Refill  . Cholecalciferol (VITAMIN D) 2000 UNITS CAPS Take 1 capsule by mouth daily.    Marland Kitchen. losartan (COZAAR) 25 MG tablet Take 1 tablet (25 mg total) by mouth daily. 30 tablet 11  . Multiple Vitamins-Minerals (CENTRUM ADULTS PO)  Take 1 tablet by mouth daily.    . citalopram (CELEXA) 20 MG tablet Take 1 tablet (20 mg total) by mouth daily. 30 tablet 6  . LORazepam (ATIVAN) 0.5 MG tablet Take 1 tablet (0.5 mg total) by mouth 2 (two) times daily as needed for anxiety. 20 tablet 0   No facility-administered medications prior to visit.      Per HPI unless specifically indicated in ROS section below Review of Systems Objective:    Ht 5' 10.5" (1.791 m)   BMI 27.61 kg/m   Wt Readings from Last 3 Encounters:  10/22/18 195 lb 3 oz (88.5 kg)  09/21/18 195 lb (88.5 kg)  09/21/18 202 lb (91.6 kg)     Physical exam: Gen: alert, NAD, not ill appearing Pulm: speaks in complete sentences without increased work of breathing Psych: normal mood, normal thought content      GAD 7 : Generalized Anxiety Score 09/21/2018  Nervous, Anxious, on Edge 1  Control/stop worrying 1  Worry too much - different things 1  Trouble relaxing 1  Restless 1  Easily annoyed or irritable 0  Afraid - awful might happen 2  Total GAD 7 Score 7   Assessment & Plan:   Problem List Items Addressed This Visit    Anxiety - Primary    No improvement after 2 months of celexa, 1 month at 20mg  dose. Ongoing anxiety described as palpitations, neck pain, shortness of breath. Will cross taper off celexa and onto effexor XR  75mg  daily. Discussed cut celexa in half for 1 wk then stop, start effexor 3d into taper. Update with effect in 1-2 months, sooner if no improvement or worsening. Reviewed elevated BP risk of effexor and to monitor blood pressures regularly with new medication.       Relevant Medications   venlafaxine XR (EFFEXOR XR) 75 MG 24 hr capsule   LORazepam (ATIVAN) 0.5 MG tablet       Meds ordered this encounter  Medications  . venlafaxine XR (EFFEXOR XR) 75 MG 24 hr capsule    Sig: Take 1 capsule (75 mg total) by mouth daily with breakfast.    Dispense:  30 capsule    Refill:  3    In place of citalopram  . LORazepam (ATIVAN) 0.5  MG tablet    Sig: Take 1 tablet (0.5 mg total) by mouth 2 (two) times daily as needed for anxiety.    Dispense:  20 tablet    Refill:  0   No orders of the defined types were placed in this encounter.   Follow up plan: No follow-ups on file.  Eustaquio Boyden, MD

## 2019-01-02 ENCOUNTER — Emergency Department: Payer: BLUE CROSS/BLUE SHIELD

## 2019-01-02 ENCOUNTER — Emergency Department
Admission: EM | Admit: 2019-01-02 | Discharge: 2019-01-02 | Disposition: A | Discharge status: Home / Self Care | Payer: BLUE CROSS/BLUE SHIELD | Attending: Emergency Medicine | Admitting: Emergency Medicine

## 2019-01-02 ENCOUNTER — Other Ambulatory Visit: Payer: Self-pay

## 2019-01-02 ENCOUNTER — Encounter: Payer: Self-pay | Admitting: Emergency Medicine

## 2019-01-02 DIAGNOSIS — I10 Essential (primary) hypertension: Secondary | ICD-10-CM | POA: Diagnosis not present

## 2019-01-02 DIAGNOSIS — Y998 Other external cause status: Secondary | ICD-10-CM | POA: Insufficient documentation

## 2019-01-02 DIAGNOSIS — S161XXA Strain of muscle, fascia and tendon at neck level, initial encounter: Secondary | ICD-10-CM

## 2019-01-02 DIAGNOSIS — Y93I9 Activity, other involving external motion: Secondary | ICD-10-CM | POA: Diagnosis not present

## 2019-01-02 DIAGNOSIS — S299XXA Unspecified injury of thorax, initial encounter: Secondary | ICD-10-CM | POA: Diagnosis not present

## 2019-01-02 DIAGNOSIS — S20219A Contusion of unspecified front wall of thorax, initial encounter: Secondary | ICD-10-CM | POA: Diagnosis not present

## 2019-01-02 DIAGNOSIS — M542 Cervicalgia: Secondary | ICD-10-CM | POA: Diagnosis not present

## 2019-01-02 DIAGNOSIS — R079 Chest pain, unspecified: Secondary | ICD-10-CM | POA: Diagnosis not present

## 2019-01-02 DIAGNOSIS — Y92410 Unspecified street and highway as the place of occurrence of the external cause: Secondary | ICD-10-CM | POA: Diagnosis not present

## 2019-01-02 DIAGNOSIS — S199XXA Unspecified injury of neck, initial encounter: Secondary | ICD-10-CM | POA: Diagnosis not present

## 2019-01-02 MED ORDER — BACLOFEN 10 MG PO TABS: 10.0000 mg | ORAL_TABLET | Freq: Three times a day (TID) | ORAL | 1 refills | Status: DC

## 2019-01-02 MED ORDER — MELOXICAM 15 MG PO TABS: 15.0000 mg | ORAL_TABLET | Freq: Every day | ORAL | 2 refills | Status: DC
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
## 2019-01-02 NOTE — ED Notes (Signed)
Awaiting x-rays

## 2019-01-02 NOTE — ED Notes (Signed)
EKG completed

## 2019-01-02 NOTE — Discharge Instructions (Addendum)
Follow-up with your regular doctor if not better in 3 to 5 days.  Or follow-up with orthopedics if your neck pain is not better in 1 week.  Return emergency department if worsening.  Take medications as prescribed.  Apply ice to all areas that hurt.

## 2019-01-21 ENCOUNTER — Ambulatory Visit: Payer: BLUE CROSS/BLUE SHIELD | Admitting: Family Medicine

## 2019-01-26 DIAGNOSIS — G4733 Obstructive sleep apnea (adult) (pediatric): Secondary | ICD-10-CM | POA: Diagnosis not present

## 2019-02-04 ENCOUNTER — Other Ambulatory Visit: Payer: Self-pay | Admitting: Family Medicine

## 2019-02-04 NOTE — Telephone Encounter (Addendum)
Last filled 12-21-18 #20 Last OV 10-22-18 Next OV 08-12-19 Jesse Phillips

## 2019-02-04 NOTE — Telephone Encounter (Signed)
Eprescribed.

## 2019-02-14 LAB — IRON,TIBC AND FERRITIN PANEL: Iron: 115

## 2019-02-14 LAB — HEPATIC FUNCTION PANEL
ALT: 28 (ref 10–40)
AST: 26 (ref 14–40)
Alkaline Phosphatase: 55 (ref 25–125)
Bilirubin, Total: 0.9

## 2019-02-14 LAB — LIPID PANEL
Cholesterol: 229 — AB (ref 0–200)
HDL: 68 (ref 35–70)
LDL Cholesterol: 139
Triglycerides: 111 (ref 40–160)

## 2019-02-14 LAB — HEMOGLOBIN A1C: Hemoglobin A1C: 5.6

## 2019-02-14 LAB — BASIC METABOLIC PANEL
Creatinine: 1 (ref 0.6–1.3)
Glucose: 122
Potassium: 4.7 (ref 3.4–5.3)

## 2019-02-14 LAB — PSA: PSA: 0.7

## 2019-02-14 LAB — VITAMIN B12: Vitamin B-12: 905

## 2019-02-14 LAB — VITAMIN D 25 HYDROXY (VIT D DEFICIENCY, FRACTURES): Vit D, 25-Hydroxy: 39.3

## 2019-02-14 LAB — TSH: TSH: 1.57 (ref 0.41–5.90)

## 2019-02-14 LAB — CBC AND DIFFERENTIAL
Hemoglobin: 17.2 (ref 13.5–17.5)
Platelets: 258 (ref 150–399)
WBC: 6

## 2019-02-25 ENCOUNTER — Telehealth: Payer: Self-pay | Admitting: Family Medicine

## 2019-02-25 NOTE — Telephone Encounter (Signed)
Labs received and placed in Jesse Phillips's box for input/scanning. plz notify pt - I received labs. Sugar a bit high - avoid added sugars in diet. Kidneys, liver, thyroid, blood counts and prostate all ok. Chol levels also a bit high - better sugar control should help this as well.

## 2019-02-28 ENCOUNTER — Encounter: Payer: Self-pay | Admitting: Family Medicine

## 2019-02-28 NOTE — Telephone Encounter (Signed)
Attempted to contact pt.  No answer.  Cell vm box is full.  Need to relay results and message per Dr. [place] Callas labs and placed for scanning.]

## 2019-02-28 NOTE — Telephone Encounter (Signed)
Spoke with pt relaying results and message per Dr. G.  Pt verbalizes understanding. 

## 2019-06-01 DIAGNOSIS — G4733 Obstructive sleep apnea (adult) (pediatric): Secondary | ICD-10-CM | POA: Diagnosis not present

## 2019-06-02 ENCOUNTER — Ambulatory Visit: Payer: Self-pay | Admitting: Internal Medicine

## 2019-06-09 ENCOUNTER — Ambulatory Visit: Payer: BC Managed Care – PPO | Admitting: Internal Medicine

## 2019-06-09 ENCOUNTER — Other Ambulatory Visit: Payer: Self-pay

## 2019-06-09 ENCOUNTER — Encounter: Payer: Self-pay | Admitting: Internal Medicine

## 2019-06-09 VITALS — BP 141/83 | HR 86 | Temp 97.8°F | Resp 16 | Ht 71.0 in | Wt 200.0 lb

## 2019-06-09 DIAGNOSIS — G4733 Obstructive sleep apnea (adult) (pediatric): Secondary | ICD-10-CM | POA: Diagnosis not present

## 2019-06-09 DIAGNOSIS — R030 Elevated blood-pressure reading, without diagnosis of hypertension: Secondary | ICD-10-CM

## 2019-06-09 DIAGNOSIS — E669 Obesity, unspecified: Secondary | ICD-10-CM

## 2019-06-09 DIAGNOSIS — R011 Cardiac murmur, unspecified: Secondary | ICD-10-CM | POA: Diagnosis not present

## 2019-06-09 DIAGNOSIS — Z9989 Dependence on other enabling machines and devices: Secondary | ICD-10-CM
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
## 2019-07-01 ENCOUNTER — Ambulatory Visit (INDEPENDENT_AMBULATORY_CARE_PROVIDER_SITE_OTHER): Payer: BC Managed Care – PPO | Admitting: Internal Medicine

## 2019-07-01 DIAGNOSIS — G4733 Obstructive sleep apnea (adult) (pediatric): Secondary | ICD-10-CM | POA: Diagnosis not present

## 2019-07-01 DIAGNOSIS — Z9989 Dependence on other enabling machines and devices: Secondary | ICD-10-CM | POA: Diagnosis not present

## 2019-07-08 ENCOUNTER — Telehealth: Payer: Self-pay

## 2019-07-08 NOTE — Telephone Encounter (Signed)
Called lmom informing patient of appointment. klh 

## 2019-07-12 ENCOUNTER — Other Ambulatory Visit: Payer: Self-pay

## 2019-07-12 ENCOUNTER — Ambulatory Visit: Payer: BC Managed Care – PPO | Admitting: Internal Medicine

## 2019-07-12 ENCOUNTER — Encounter: Payer: Self-pay | Admitting: Internal Medicine

## 2019-07-12 VITALS — BP 140/80 | HR 74 | Temp 97.6°F | Resp 16 | Ht 71.0 in | Wt 202.0 lb

## 2019-07-12 DIAGNOSIS — R030 Elevated blood-pressure reading, without diagnosis of hypertension: Secondary | ICD-10-CM

## 2019-07-12 DIAGNOSIS — E669 Obesity, unspecified: Secondary | ICD-10-CM | POA: Diagnosis not present

## 2019-07-12 DIAGNOSIS — G4733 Obstructive sleep apnea (adult) (pediatric): Secondary | ICD-10-CM | POA: Diagnosis not present
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
## 2019-07-13 ENCOUNTER — Telehealth: Payer: Self-pay

## 2019-07-13 NOTE — Telephone Encounter (Signed)
Faxed [facility] order for new cpap auto pap 2 week set up to be done at pt home. Beth

## 2019-07-18 NOTE — Procedures (Signed)
[facility] [address] [place], [place] [address]  Patient Name: Jesse Phillips DOB: 1966/04/24   SLEEP STUDY INTERPRETATION  DATE OF SERVICE: July 01, 2019   SLEEP STUDY HISTORY: This patient is referred to the sleep lab for a baseline Polysomnography. Pertinent history includes a history of diagnosis of excessive daytime somnolence and snoring.  PROCEDURE: This overnight polysomnogram was performed using the Alice 5 acquisition system using the standard diagnostic protocol as outlined by the AASM. This includes 6 channels of EEG, 2 channelscannels of EOG, chin EMG, bilateral anterior tibialis EMG, nasal/oral thermister, PTAF, chest and abdominal wall movements, ECG and pulse oximetry. Apneas and Hypopneas were scored per AASM definition.  SLEEP ARCHITECHTURE: This is a baseline polysomnograph  study. The total recording time was 417.3 minutes and the patients total sleep time is noted to be 283.5 minutes. Sleep onset latency was 26.0 minutes and is prolonged.  Stage R sleep onset latency was 67.0 minutes. Sleep maintenance efficiency was 68.3% and is decreased.  Sleep staging expressed as a percentage of total sleep time demonstrated in 19.8% N1, 37.6% N2 and 12.2% N3  sleep. Stage R represents 30.5% of total sleep time. This is increased.  There were a total of 2 arousals  for an overall arousal index of 0.4 per hour of sleep. PLMS arousal were not noted. Arousals without respiratory events are  noted. This can contribute to sleep architechture disruption.  RESPIRATORY MONITORING:   Patient exhibits some evidence of sleep disorderd breathing characterized by 1 central apneas, 0 obstructive apneas and 1 mixed apneas. There were 14 obstructive hypopneas and 5 RERAs. Most of the apneas/hypopneas were of obstructive variety. The total apnea hypopnea index (apneas and hypopneas per hour of sleep) is 3.4 respiratory events per hour and is within normal limits.  Respiratory  monitoring demonstrated mild snoring through the night. There are a total of 45 snoring episodes representing 1.8% of sleep.   Baseline oxygen saturation during wakefulness was 96% and during NREM sleep averaged 96% through the night. Arterial saturation during REM sleep was 96% through the night. There was no significant  oxygen desaturation with the respiratory events. Arterial oxygen desaturation occurred of at least 4% was noted with a low saturation of 91%. The study was performed off oxygen.  CARDIAC MONITORING:   Average heart rate is 57 during sleep with a high of 92 beats per minute. Malignant arrhythmias were not noted.  CPAP titration: Patient was titrated on CPAP from a pressure of 5 cm water pressure up to a maximum of 10 cm water pressure.  Patient did well was able to tolerate the CPAP without any issues.  On a CPAP of 10 patient slept for 130.7 minutes and exhibited 66.7 minutes of stage R there were no apneas no hypopneas and no significant oxygen desaturation was noted on this pressure.  The total AHI was 0.0/h and the lowest saturation was 93%  IMPRESSIONS:  --This overnight polysomnogram demonstrates insignificant sleep apnea with an overall AHI 3.4 per hour. --The overall AHI was no worse  during Stage R. --There were associated insignificant arterial oxygen desaturations noted with a lowest saturation of 91% --There was no significant PLMS noted in this study. --There is mild snoring noted throughout the study.    RECOMMENDATIONS:  --CPAP titration study is adequate to control this patient's obstructive sleep apnea.  It appears the optimal pressure is 10 cm water pressure on this pressure the patient had an AHI of 0 and there was  no significant oxygen desaturation noted.. --Nasal decongestants and antihistamines may be of help for increased upper airways resistance when present. --Weight loss through dietary and lifestyle modification is recommended in the presence of  obesity. --A search for and treatment of any underlying cardiopulmonary disease is      recommended in the presence of oxygen desaturations. --Alternative treatment options if the patient is not willing to use CPAP include oral   appliances as well as surgical intervention which may help in the appropriate patient. --Clinical correlation is recommended. Please feel free to call the office for any further  questions or assistance in the care of this patient.     Yevonne Pax, MD [facility] Pulmonary Critical Care Medicine Sleep medicine

## 2019-08-04 ENCOUNTER — Telehealth: Payer: Self-pay

## 2019-08-04 NOTE — Telephone Encounter (Signed)
CONFIRMATION OF VERBAL ORDER SIGNED AND PLACED IN [employer] PATIENT FOLDER. °

## 2019-08-12 ENCOUNTER — Ambulatory Visit (INDEPENDENT_AMBULATORY_CARE_PROVIDER_SITE_OTHER): Payer: BC Managed Care – PPO | Admitting: Family Medicine

## 2019-08-12 ENCOUNTER — Other Ambulatory Visit: Payer: Self-pay

## 2019-08-12 ENCOUNTER — Encounter: Payer: Self-pay | Admitting: Family Medicine

## 2019-08-12 VITALS — BP 150/82 | HR 78 | Temp 98.1°F | Ht 70.5 in | Wt 210.2 lb

## 2019-08-12 DIAGNOSIS — G4733 Obstructive sleep apnea (adult) (pediatric): Secondary | ICD-10-CM | POA: Diagnosis not present

## 2019-08-12 DIAGNOSIS — E663 Overweight: Secondary | ICD-10-CM

## 2019-08-12 DIAGNOSIS — F419 Anxiety disorder, unspecified: Secondary | ICD-10-CM

## 2019-08-12 DIAGNOSIS — I10 Essential (primary) hypertension: Secondary | ICD-10-CM

## 2019-08-12 DIAGNOSIS — E785 Hyperlipidemia, unspecified: Secondary | ICD-10-CM

## 2019-08-12 DIAGNOSIS — Z9989 Dependence on other enabling machines and devices: Secondary | ICD-10-CM

## 2019-08-12 DIAGNOSIS — Z0000 Encounter for general adult medical examination without abnormal findings: Secondary | ICD-10-CM

## 2019-08-12 DIAGNOSIS — Z1211 Encounter for screening for malignant neoplasm of colon: Secondary | ICD-10-CM

## 2019-08-12 DIAGNOSIS — E559 Vitamin D deficiency, unspecified: Secondary | ICD-10-CM

## 2019-08-12 DIAGNOSIS — M5030 Other cervical disc degeneration, unspecified cervical region: Secondary | ICD-10-CM

## 2019-08-12 MED ORDER — VITAMIN C 500 MG PO CAPS: 1.0000 | ORAL_CAPSULE | Freq: Every day | ORAL | Status: AC

## 2019-08-12 NOTE — Patient Instructions (Addendum)
Pas by lab to pick up stool kit.  Empieze regimen de ejercicio.  Gusto verlo hoy. Traigame resultados de proximos examenes de Counselling psychologist en 1 ao para proximo examen fisico  Mantenimiento de Teacher, English as a foreign language, Male Adoptar un estilo de vida saludable y recibir atencin preventiva son importantes para promover la salud y Musician. Consulte al mdico sobre:  El esquema adecuado para hacerse pruebas y exmenes peridicos.  Cosas que puede hacer por su cuenta para prevenir enfermedades y [place] sano. Qu debo saber sobre la dieta, el peso y el ejercicio? Consuma una dieta saludable   Consuma una dieta que incluya muchas verduras, frutas, productos lcteos con bajo contenido de [place] y Advertising account planner.  No consuma muchos alimentos ricos en grasas slidas, azcares agregados o sodio. Mantenga un peso saludable El ndice de masa muscular [facility]) es una medida que puede utilizarse para identificar posibles problemas de [place]. Proporciona una estimacin de la grasa corporal basndose en el peso y la altura. Su mdico puede ayudarle a Radiation protection practitioner [place] y a Scientist, forensic o Theatre manager un peso saludable. Haga ejercicio con regularidad Haga ejercicio con regularidad. Esta es una de las prcticas ms importantes que puede hacer por su salud. La mayora de los adultos deben seguir estas pautas:  Optometrist, al menos, 164mnutos de actividad fsica por semana. El ejercicio debe aumentar la frecuencia cardaca y hNature conservation officertranspirar (ejercicio de intensidad moderada).  Hacer ejercicios de fortalecimiento por lo mHalliburton Companypor semana. Agregue esto a su plan de ejercicio de intensidad moderada.  Pasar menos tiempo sentados. Incluso la actividad fsica ligera puede ser beneficiosa. Controle sus niveles de colesterol y lpidos en la sangre Comience a realizarse anlisis de lpidos y cResearch officer, trade unionen la sangre a los 20aos y luego reptalos cada 5aos. Es posible que nAdministrator, artslos niveles de colesterol con mayor frecuencia si:  Sus niveles de lpidos y colesterol son altos.  Es mayor de 40aos.  Presenta un alto riesgo de padecer enfermedades cardacas. Qu debo saber sobre las pruebas de deteccin del cncer? Muchos tipos de cncer pueden detectarse de manera temprana y, a menudo, pueden prevenirse. Segn su historia clnica y sus antecedentes familiares, es posible que deba realizarse pruebas de deteccin del cncer en diferentes edades. Esto puede incluir pruebas de deteccin de lo siguiente:  CSurveyor, minerals  Cncer de prstata.  Cncer de piel.  Cncer de pulmn. Qu debo saber sobre la enfermedad cardaca, la diabetes y la hipertensin arterial? Presin arterial y enfermedad cardaca  La hipertensin arterial causa enfermedades cardacas y aSerbiael riesgo de accidente cerebrovascular. Es ms probable que esto se manifieste en las personas que tienen lecturas de presin arterial alta, tienen ascendencia africana o tienen sobrepeso.  Hable con el mdico sobre sus valores de presin arterial deseados.  Hgase controlar la presin arterial: ? Cada 3 a 5 aos si tiene entre 18 y 371aos. ? Todos los aos si es mayor de [place]  Si tiene entre 625y 723aos y es fumador o sInsurance account manager pregntele al mdico si debe realizarse una prueba de deteccin de aneurisma artico abdominal (AAA) por nica vez. Diabetes Realcese exmenes de deteccin de la diabetes con regularidad. Este anlisis revisa el nivel de azcar en la sangre en aPiedmont Hgase las pruebas de deteccin:  Cada tresaos despus de los 456aosde edad si tiene un peso normal y un bajo riesgo de padecer diabetes.  Con ms frecuencia y a pProofreaderde uWest Falls Church  edad inferior si tiene sobrepeso o un alto riesgo de padecer diabetes. Qu debo saber sobre la prevencin de infecciones? Hepatitis B Si tiene un riesgo ms alto de contraer hepatitis B, debe someterse a un examen de deteccin  de este virus. Hable con el mdico para averiguar si tiene riesgo de contraer la infeccin por hepatitis B. Hepatitis C Se recomienda un anlisis de [place] para:  Todos los que nacieron entre 1946 y [phone].  Todas las personas que tengan un riesgo de haber contrado hepatitis C. Enfermedades de transmisin sexual (ETS)  Debe realizarse pruebas de deteccin de ITS todos los aos, incluidas la gonorrea y la clamidia, si: ? Es sexualmente activo y es menor de 24aos. ? Es mayor de 24aos, y Investment banker, operational informa que corre riesgo de tener este tipo de infecciones. ? La actividad sexual ha cambiado desde que le hicieron la ltima prueba de deteccin y tiene un riesgo mayor de Best boy clamidia o Radio broadcast assistant. Pregntele al mdico si usted tiene riesgo.  Pregntele al mdico si usted tiene un alto riesgo de Museum/gallery curator VIH. El mdico tambin puede recomendarle un medicamento recetado para ayudar a evitar la infeccin por el VIH. Si elige tomar medicamentos para prevenir el VIH, primero debe [employer] de deteccin del VIH. Luego debe hacerse anlisis cada 3mses mientras est tomando los medicamentos. Siga estas instrucciones en su casa: Estilo de vida  No consuma ningn producto que contenga nicotina o tabaco, como cigarrillos, cigarrillos electrnicos y tabaco de mHigher education careers adviser Si necesita ayuda para dejar de fumar, consulte al mdico.  No consuma drogas.  No comparta agujas.  Solicite ayuda a su mdico si necesita apoyo o informacin para abandonar las drogas. Consumo de alcohol  No beba alcohol si el mdico se lo prohbe.  Si bebe alcohol: ? Limite la cantidad que consume de 0 a 2 medidas por da. ? Est atento a la cantidad de alcohol que hay en las bebidas que toma. En los [place] una medida equivale a una botella de cerveza de 12oz (3550m, un vaso de vino de 5oz (14845mo un vaso de una bebida alcohlica de alta graduacin de 1oz (72m61mInstrucciones generales  Realcese  los estudios de rutina de la salud, dentales y de la vPublic librarianantFloyd Hillnfrmele a su mdico si: ? Se siente deprimido con frecuencia. ? Alguna vez ha sido vctima de maltCowleso se siente seguro en su casa. Resumen  Adoptar un estilo de vida saludable y recibir atencin preventiva son importantes para promover la salud y el bMusicianiga las instrucciones del mdico acerca de una dieta saludable, el ejercicio y la realizacin de pruebas o exmenes para deteEngineer, building servicesiga las instrucciones del mdico con respecto al control del colesterol y la presin arterial. Esta informacin no tiene comoMarine scientistconsejo del mdico. Asegrese de hacerle al mdico cualquier pregunta que tenga. Document Released: 02/07/2008 Document Revised: 09/01/2018 Document Reviewed: 09/01/2018 Elsevier Patient Education  2020 [employer]
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
## 2019-08-12 NOTE — Assessment & Plan Note (Addendum)
Preventative protocols reviewed and updated unless pt declined. Discussed healthy diet and lifestyle.  Will bring upcoming labs through work at end of month.

## 2019-08-13 DIAGNOSIS — M5030 Other cervical disc degeneration, unspecified cervical region: Secondary | ICD-10-CM | POA: Insufficient documentation

## 2019-08-13 NOTE — Assessment & Plan Note (Signed)
Overall improved. Now off medication (previously on effexor and lorazepam). Reviewed healthy stress relieving strategies. Discussed importance of marital relationship and communication. Encouraged he talk to pastor about day to day struggles. Discussed option of individual counseling, which he is open to, but would like to start by talking with his pastor

## 2019-08-13 NOTE — Assessment & Plan Note (Signed)
Chronic, mild off statin. The 10-year ASCVD risk score Mikey Bussing DC Brooke Bonito., et al., 2013) is: 7%*   Values used to calculate the score:     Age: 53 years     Sex: Male     Is Non-Hispanic African American: No     Diabetic: No     Tobacco smoker: No     Systolic Blood Pressure: 675 mmHg     Is BP treated: Yes     HDL Cholesterol: 68 mg/dL*     Total Cholesterol: 229 mg/dL*     * - Cholesterol units were assumed for this score calculation

## 2019-08-13 NOTE — Assessment & Plan Note (Signed)
Xray from 12/2018 reviewed with patient - he did have anterior osteophyte formation from C4-7. Encouraged tylenol, ibuprofen PRN, gentle stretching.

## 2019-08-13 NOTE — Assessment & Plan Note (Signed)
Stable period on losartan 25mg  daily. Continue. Work readings averaging well controlled.

## 2019-08-13 NOTE — Assessment & Plan Note (Signed)
Levels improved on daily replacement.

## 2019-08-13 NOTE — Assessment & Plan Note (Signed)
On CPAP. Continue pulm f/u.

## 2019-08-13 NOTE — Assessment & Plan Note (Signed)
Encouraged regular exercise routine.  

## 2019-08-23 ENCOUNTER — Other Ambulatory Visit (INDEPENDENT_AMBULATORY_CARE_PROVIDER_SITE_OTHER): Payer: BC Managed Care – PPO

## 2019-08-23 DIAGNOSIS — Z1211 Encounter for screening for malignant neoplasm of colon: Secondary | ICD-10-CM | POA: Diagnosis not present

## 2019-08-23 LAB — FECAL OCCULT BLOOD, IMMUNOCHEMICAL: Fecal Occult Bld: NEGATIVE

## 2019-08-23 LAB — FECAL OCCULT BLOOD, GUAIAC: Fecal Occult Blood: NEGATIVE

## 2019-08-25 ENCOUNTER — Encounter: Payer: Self-pay | Admitting: Family Medicine

## 2019-09-14 DIAGNOSIS — Z0130 Encounter for examination of blood pressure without abnormal findings: Secondary | ICD-10-CM | POA: Diagnosis not present

## 2019-09-23 ENCOUNTER — Other Ambulatory Visit: Payer: Self-pay | Admitting: Family Medicine

## 2019-10-05 DIAGNOSIS — M549 Dorsalgia, unspecified: Secondary | ICD-10-CM | POA: Diagnosis not present

## 2019-10-05 DIAGNOSIS — Z0131 Encounter for examination of blood pressure with abnormal findings: Secondary | ICD-10-CM | POA: Diagnosis not present

## 2019-10-26 DIAGNOSIS — R21 Rash and other nonspecific skin eruption: Secondary | ICD-10-CM | POA: Diagnosis not present

## 2019-10-26 DIAGNOSIS — Z0130 Encounter for examination of blood pressure without abnormal findings: Secondary | ICD-10-CM | POA: Diagnosis not present

## 2019-12-21 DIAGNOSIS — Z0131 Encounter for examination of blood pressure with abnormal findings: Secondary | ICD-10-CM | POA: Diagnosis not present

## 2020-01-04 DIAGNOSIS — Z0130 Encounter for examination of blood pressure without abnormal findings: Secondary | ICD-10-CM | POA: Diagnosis not present

## 2020-01-12 DIAGNOSIS — R030 Elevated blood-pressure reading, without diagnosis of hypertension: Secondary | ICD-10-CM | POA: Diagnosis not present

## 2020-01-18 DIAGNOSIS — Z0189 Encounter for other specified special examinations: Secondary | ICD-10-CM | POA: Diagnosis not present

## 2020-01-18 LAB — HEMOGLOBIN A1C: Hemoglobin A1C: 5.8

## 2020-01-18 LAB — BASIC METABOLIC PANEL
Glucose: 71
Potassium: 4.1 (ref 3.4–5.3)
Sodium: 141 (ref 137–147)

## 2020-01-18 LAB — TSH: TSH: 1.76 (ref 0.41–5.90)

## 2020-01-18 LAB — HEPATIC FUNCTION PANEL
ALT: 32 (ref 10–40)
AST: 29 (ref 14–40)
Alkaline Phosphatase: 52 (ref 25–125)
Bilirubin, Total: 0.7

## 2020-01-18 LAB — CBC AND DIFFERENTIAL
Hemoglobin: 16.2 (ref 13.5–17.5)
Platelets: 223 (ref 150–399)
WBC: 5.4

## 2020-01-18 LAB — LIPID PANEL
Cholesterol: 230 — AB (ref 0–200)
HDL: 50 (ref 35–70)
LDL Cholesterol: 156
Triglycerides: 132 (ref 40–160)

## 2020-01-18 LAB — VITAMIN D 25 HYDROXY (VIT D DEFICIENCY, FRACTURES): Vit D, 25-Hydroxy: 40

## 2020-01-18 LAB — COMPREHENSIVE METABOLIC PANEL: Albumin: 4.7 (ref 3.5–5.0)

## 2020-01-18 LAB — PSA: PSA: 1.4

## 2020-01-25 DIAGNOSIS — Z043 Encounter for examination and observation following other accident: Secondary | ICD-10-CM | POA: Diagnosis not present

## 2020-01-25 DIAGNOSIS — R7309 Other abnormal glucose: Secondary | ICD-10-CM | POA: Diagnosis not present

## 2020-01-25 DIAGNOSIS — Z713 Dietary counseling and surveillance: Secondary | ICD-10-CM | POA: Diagnosis not present

## 2020-01-30 ENCOUNTER — Encounter: Payer: Self-pay | Admitting: Family Medicine

## 2020-02-08 DIAGNOSIS — Z0131 Encounter for examination of blood pressure with abnormal findings: Secondary | ICD-10-CM | POA: Diagnosis not present

## 2020-02-17 IMAGING — US US ABDOMEN LIMITED
1 series · 14 of 25 positions shown · non-contrast
Comparison: None.

CLINICAL DATA: Right upper quadrant pain and elevated bilirubin

EXAM:
ULTRASOUND ABDOMEN LIMITED RIGHT UPPER QUADRANT

[Series 1: us abdomen limited · 0.17mm/px · 14 of 44 slices shown]
[im 1/44]
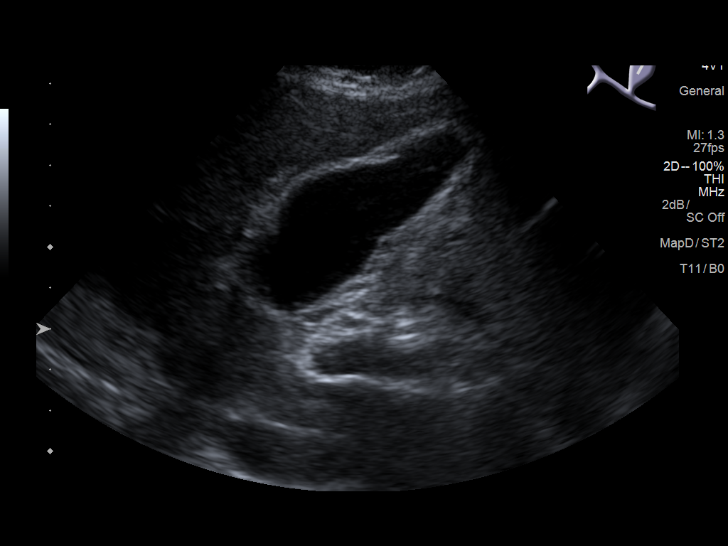
[im 4/44]
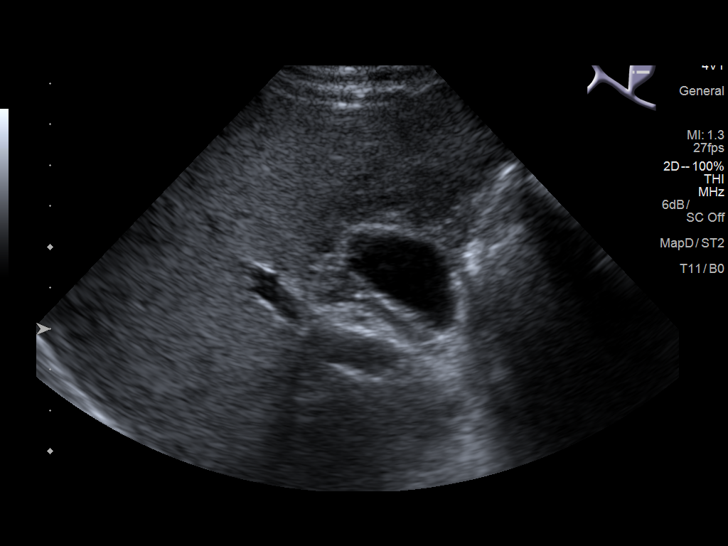
[im 8/44]
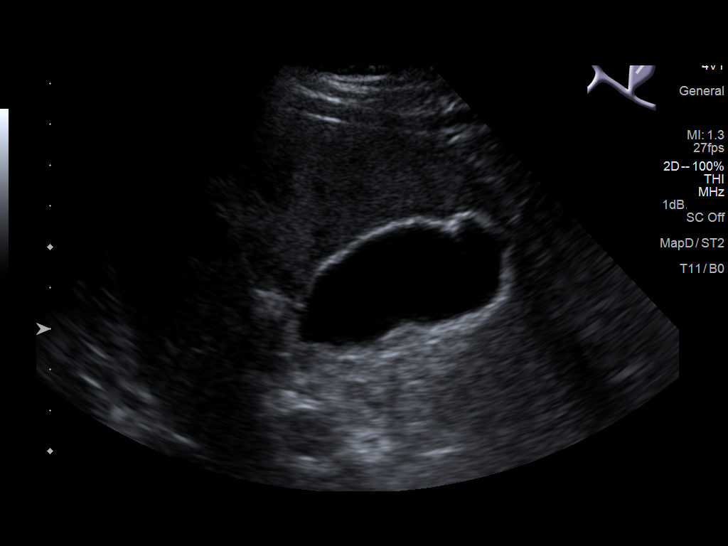
[im 11/44]
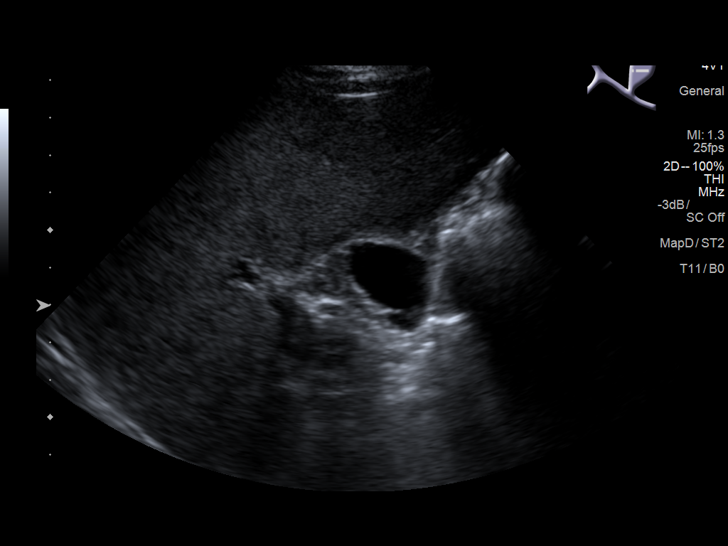
[im 15/44]
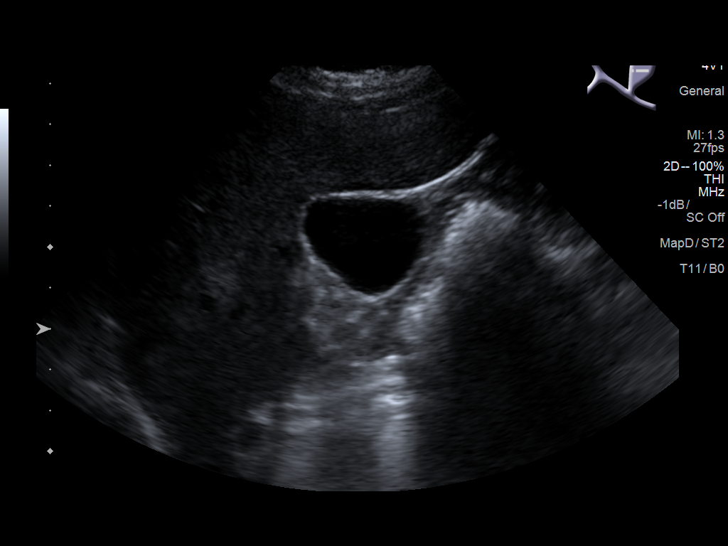
[im 17/44]
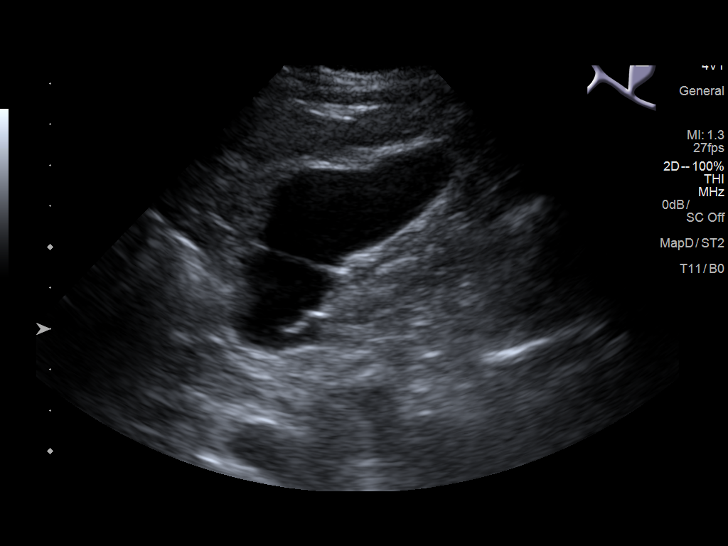
[im 20/44]
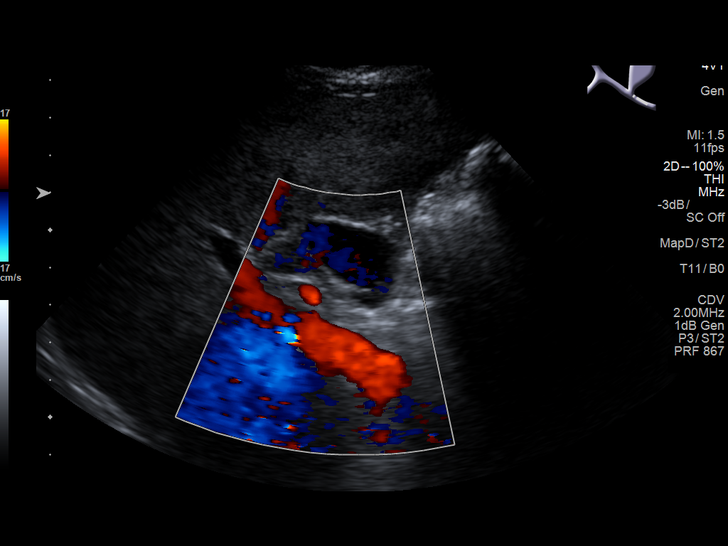
[im 24/44]
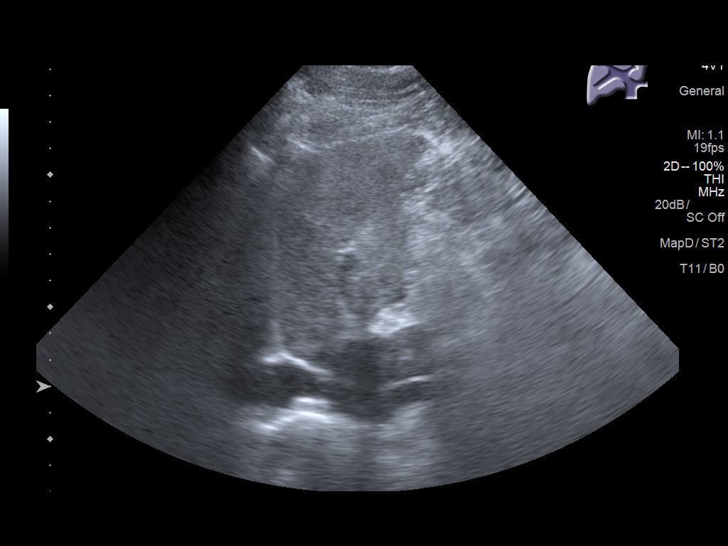
[im 27/44]
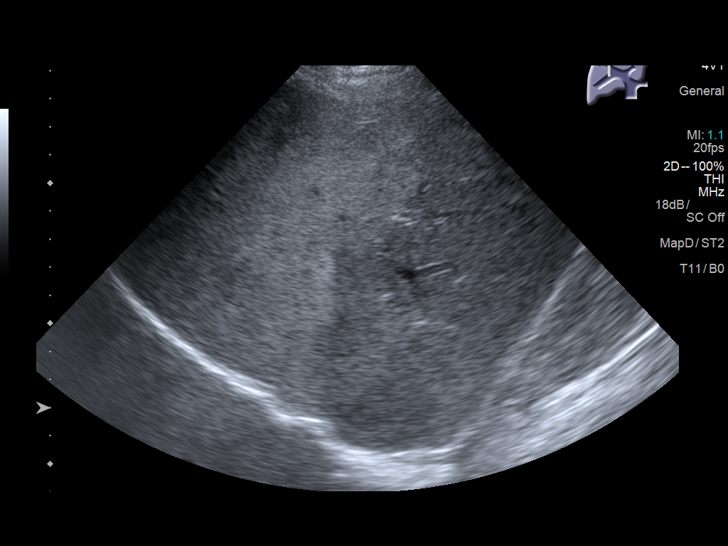
[im 29/44]
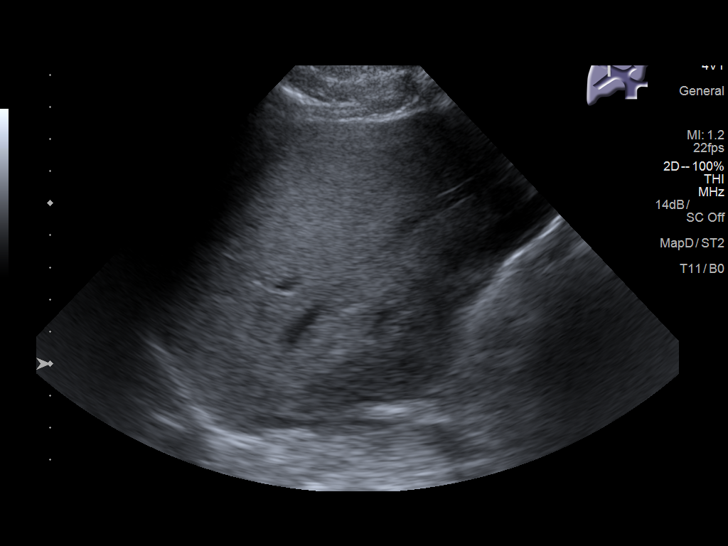
[im 33/44]
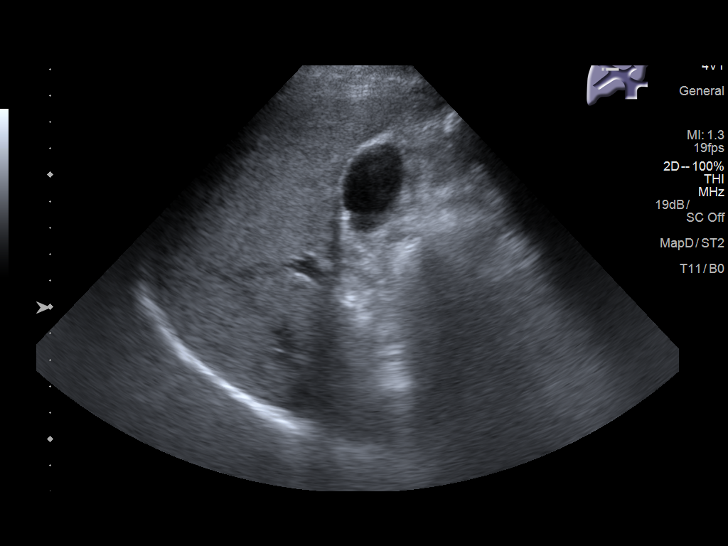
[im 36/44]
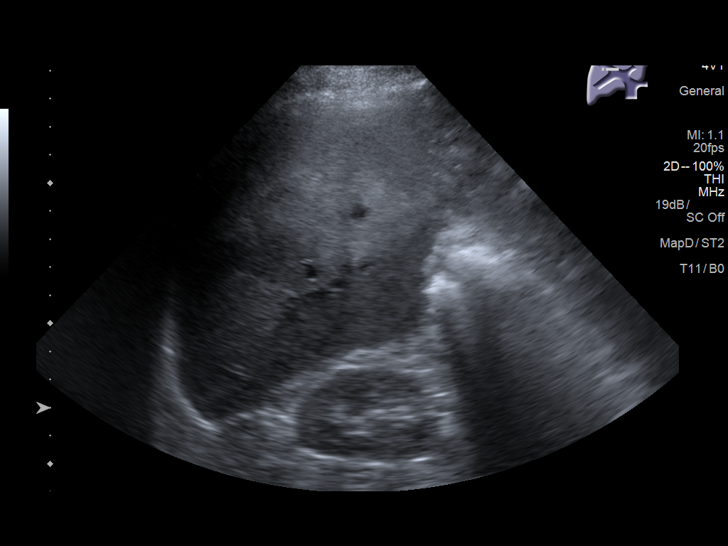
[im 40/44]
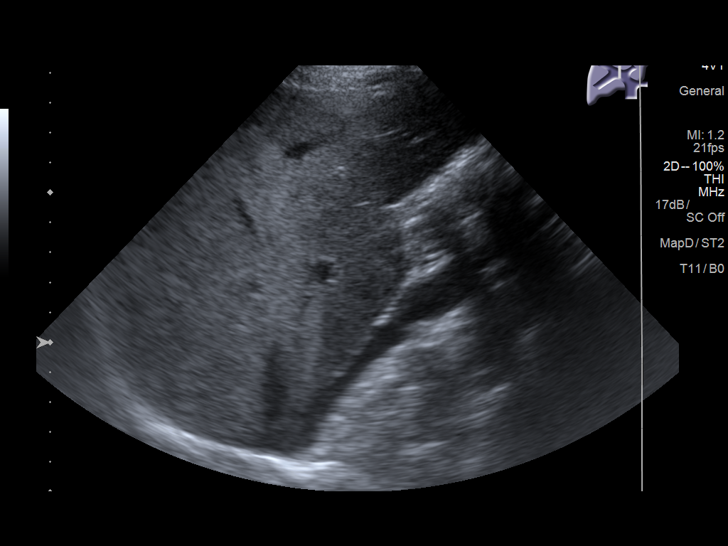
[im 44/44]
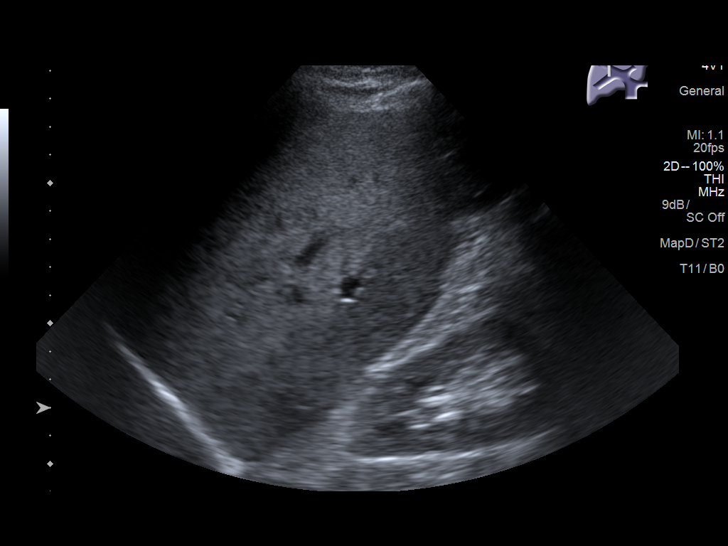

[14 of 25 positions shown; findings below may reference images not displayed]

FINDINGS: Gallbladder:

No gallstones or wall thickening visualized. No sonographic Murphy
sign noted by sonographer.

Common bile duct:

Diameter: 3 mm

Liver:

Heterogeneous liver parenchyma. No focal lesion. Portal vein is
patent on color Doppler imaging with normal direction of blood flow
towards the liver.
IMPRESSION: 1. No evidence of acute cholecystitis.
2. Heterogeneous appearance of the liver may indicate geographic
fatty infiltration. Consider correlation with CT or MRI.

## 2020-03-14 DIAGNOSIS — Z0131 Encounter for examination of blood pressure with abnormal findings: Secondary | ICD-10-CM | POA: Diagnosis not present

## 2020-03-20 ENCOUNTER — Telehealth: Payer: Self-pay

## 2020-03-20 NOTE — Telephone Encounter (Signed)
Patient confirmed appointment on 03/22/2020. klh

## 2020-03-20 NOTE — Telephone Encounter (Signed)
Called lmom informing patient of appointment on 03/22/2020. klh 

## 2020-03-22 ENCOUNTER — Ambulatory Visit (INDEPENDENT_AMBULATORY_CARE_PROVIDER_SITE_OTHER): Payer: BC Managed Care – PPO | Admitting: Internal Medicine

## 2020-03-22 ENCOUNTER — Other Ambulatory Visit: Payer: Self-pay

## 2020-03-22 ENCOUNTER — Encounter: Payer: Self-pay | Admitting: Internal Medicine

## 2020-03-22 VITALS — BP 142/80 | HR 93 | Temp 97.5°F | Resp 16 | Ht 70.0 in | Wt 216.4 lb

## 2020-03-22 DIAGNOSIS — G4733 Obstructive sleep apnea (adult) (pediatric): Secondary | ICD-10-CM | POA: Diagnosis not present

## 2020-03-22 DIAGNOSIS — E669 Obesity, unspecified: Secondary | ICD-10-CM | POA: Diagnosis not present
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
## 2020-04-04 DIAGNOSIS — Z0131 Encounter for examination of blood pressure with abnormal findings: Secondary | ICD-10-CM | POA: Diagnosis not present

## 2020-05-16 DIAGNOSIS — I10 Essential (primary) hypertension: Secondary | ICD-10-CM | POA: Diagnosis not present

## 2020-05-30 DIAGNOSIS — I10 Essential (primary) hypertension: Secondary | ICD-10-CM | POA: Diagnosis not present

## 2020-06-02 IMAGING — CR DG CHEST 2V
2 series · 2 of 2 positions shown · non-contrast
Comparison: None.

CLINICAL DATA: Chest pain for 2 weeks

EXAM:
CHEST - 2 VIEW

[chest pa]
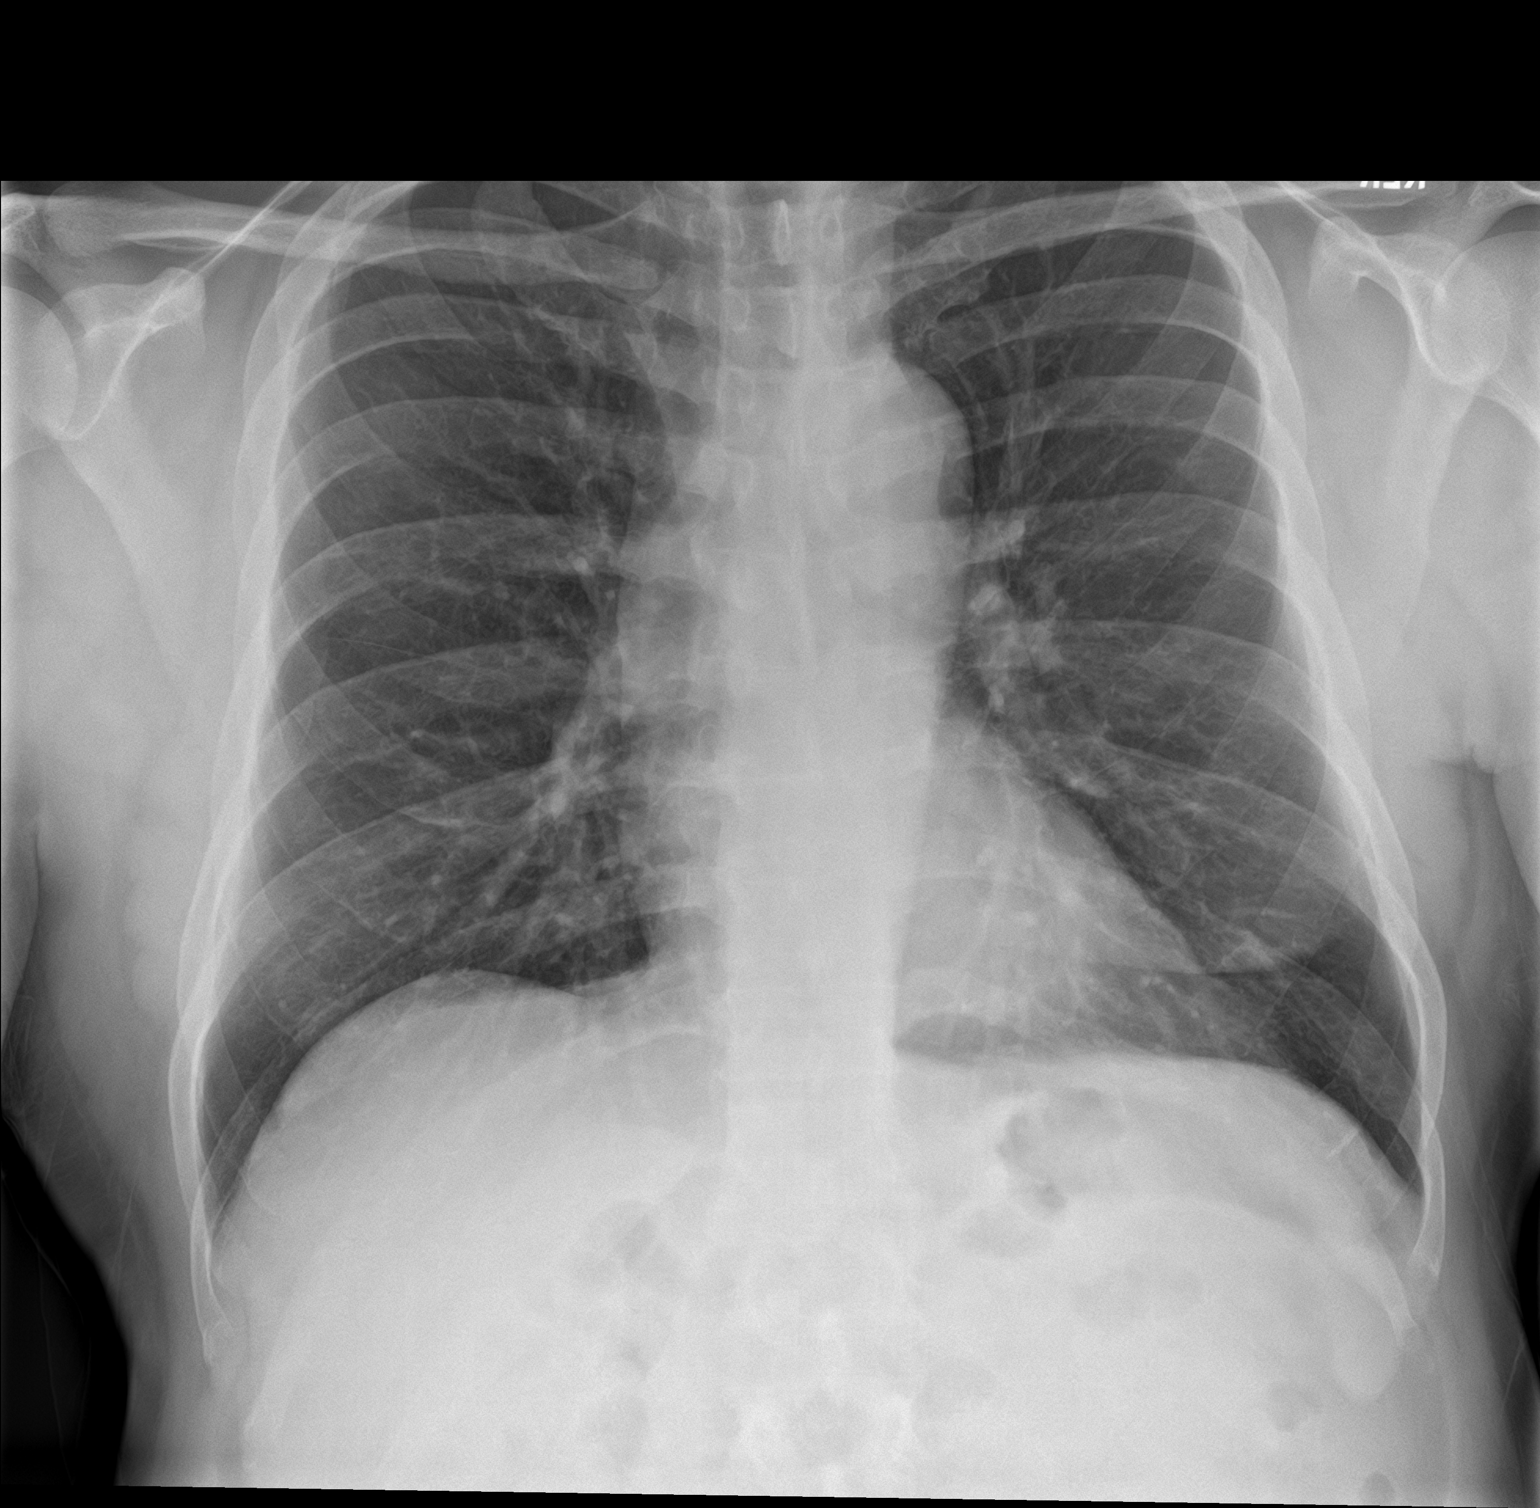

[chest lat]
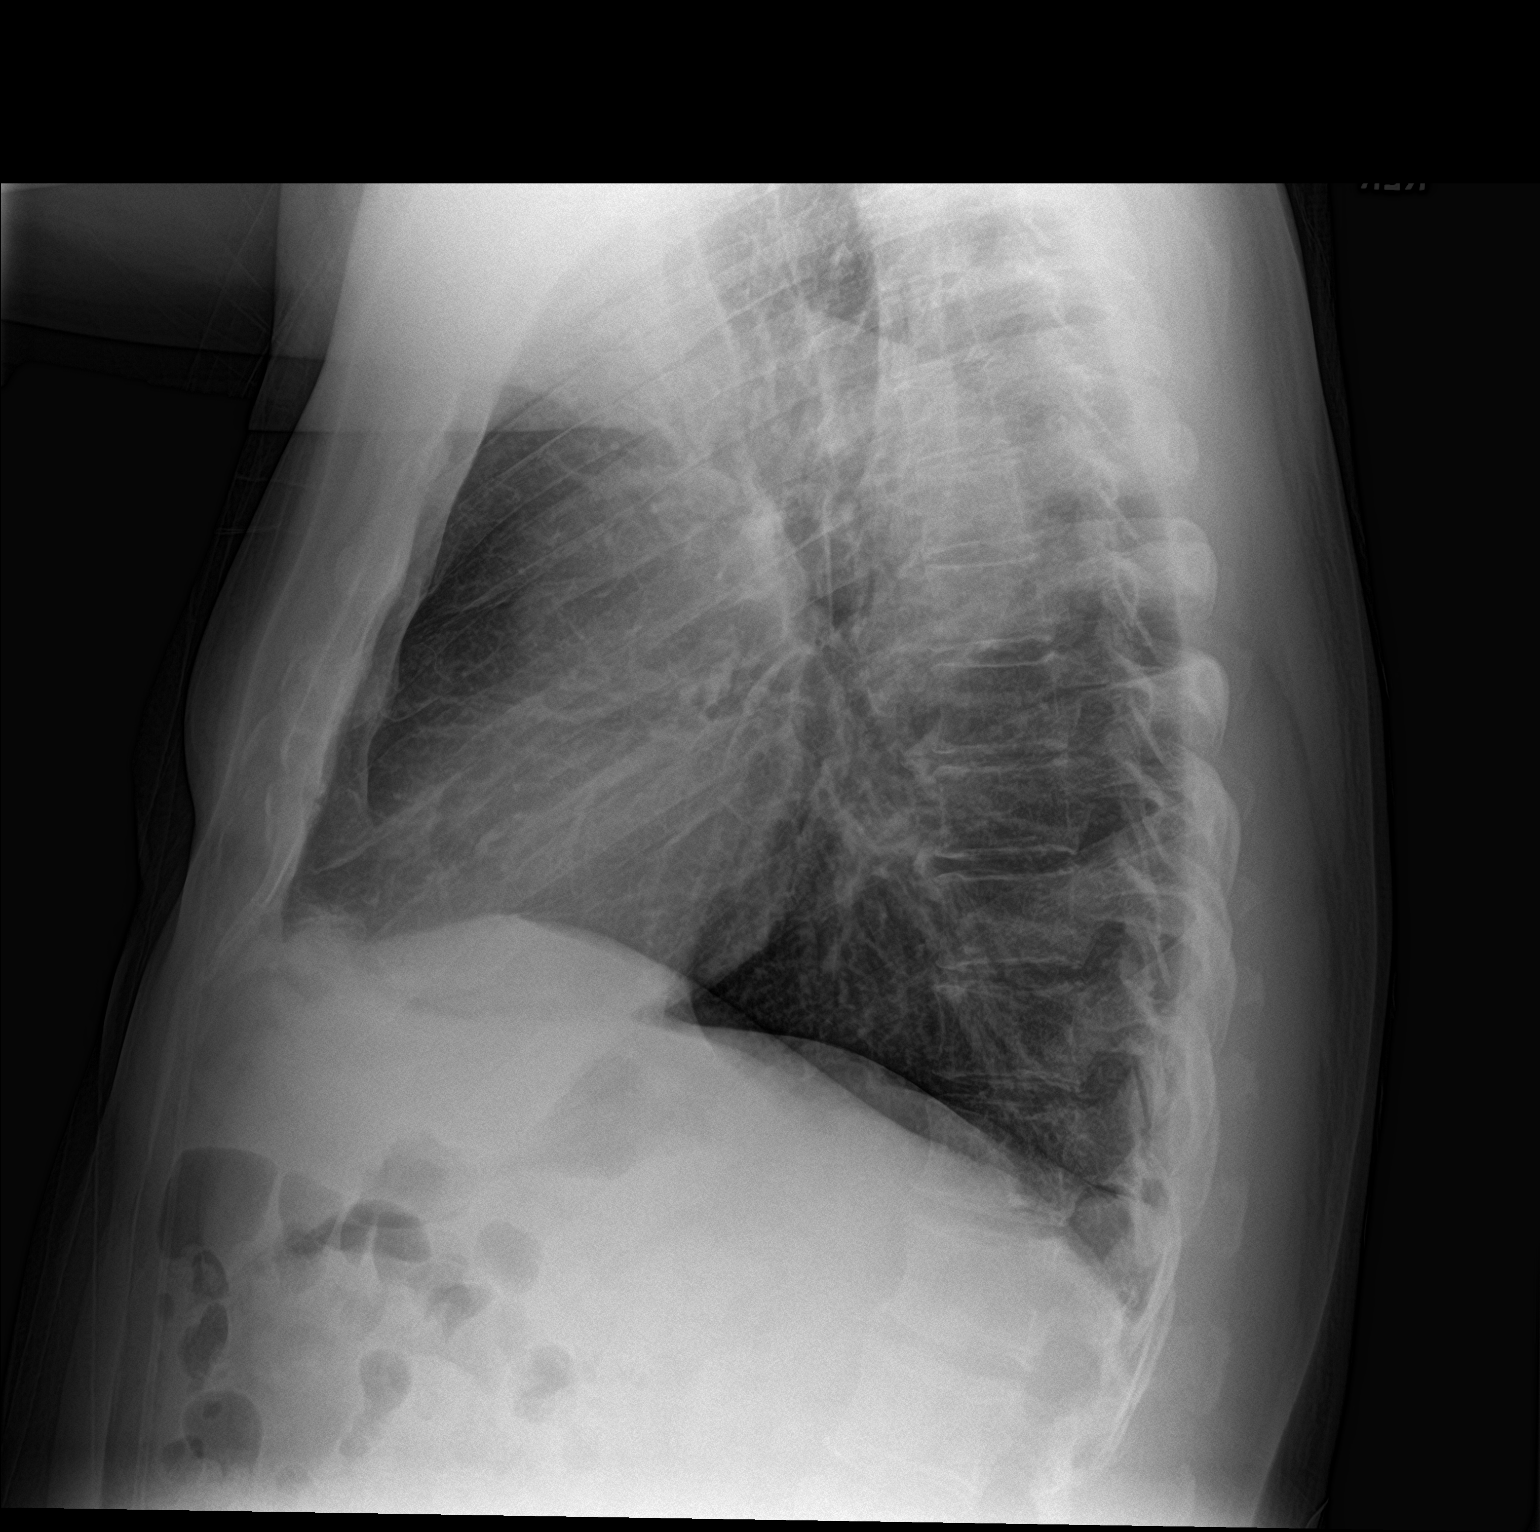

[2 of 2 positions shown; findings below may reference images not displayed]

FINDINGS: The heart size and mediastinal contours are within normal limits.
Both lungs are clear. The visualized skeletal structures are
unremarkable.
IMPRESSION: No active cardiopulmonary disease.

## 2020-06-13 DIAGNOSIS — I10 Essential (primary) hypertension: Secondary | ICD-10-CM | POA: Diagnosis not present

## 2020-06-26 ENCOUNTER — Other Ambulatory Visit: Payer: Self-pay | Admitting: Family Medicine

## 2020-06-27 DIAGNOSIS — Z0189 Encounter for other specified special examinations: Secondary | ICD-10-CM | POA: Diagnosis not present

## 2020-07-04 DIAGNOSIS — Z713 Dietary counseling and surveillance: Secondary | ICD-10-CM | POA: Diagnosis not present

## 2020-07-04 DIAGNOSIS — E785 Hyperlipidemia, unspecified: Secondary | ICD-10-CM | POA: Diagnosis not present

## 2020-07-04 DIAGNOSIS — Z043 Encounter for examination and observation following other accident: Secondary | ICD-10-CM | POA: Diagnosis not present

## 2020-07-11 DIAGNOSIS — G4733 Obstructive sleep apnea (adult) (pediatric): Secondary | ICD-10-CM | POA: Diagnosis not present

## 2020-07-11 DIAGNOSIS — Z0189 Encounter for other specified special examinations: Secondary | ICD-10-CM | POA: Diagnosis not present

## 2020-07-25 DIAGNOSIS — Z7189 Other specified counseling: Secondary | ICD-10-CM | POA: Diagnosis not present

## 2020-07-25 DIAGNOSIS — Z0131 Encounter for examination of blood pressure with abnormal findings: Secondary | ICD-10-CM | POA: Diagnosis not present

## 2020-07-27 LAB — CBC AND DIFFERENTIAL
Hemoglobin: 16.2 (ref 13.5–17.5)
Platelets: 244 (ref 150–399)
WBC: 6.2

## 2020-07-27 LAB — VITAMIN D 25 HYDROXY (VIT D DEFICIENCY, FRACTURES): Vit D, 25-Hydroxy: 42.3

## 2020-07-27 LAB — HEPATIC FUNCTION PANEL
ALT: 42 — AB (ref 10–40)
AST: 38 (ref 14–40)
Alkaline Phosphatase: 49 (ref 25–125)
Bilirubin, Total: 1.1

## 2020-07-27 LAB — BASIC METABOLIC PANEL
Creatinine: 0.9 (ref 0.6–1.3)
Glucose: 77
Potassium: 4.2 (ref 3.4–5.3)

## 2020-07-27 LAB — LIPID PANEL
Cholesterol: 254 — AB (ref 0–200)
HDL: 51 (ref 35–70)
LDL Cholesterol: 186
Triglycerides: 97 (ref 40–160)

## 2020-07-27 LAB — PSA: PSA: 0.9

## 2020-07-27 LAB — HEMOGLOBIN A1C: Hemoglobin A1C: 6

## 2020-07-27 LAB — TSH: TSH: 0.58 (ref 0.41–5.90)

## 2020-07-30 ENCOUNTER — Encounter: Payer: Self-pay | Admitting: Family Medicine

## 2020-08-08 DIAGNOSIS — I10 Essential (primary) hypertension: Secondary | ICD-10-CM | POA: Diagnosis not present

## 2020-08-10 DIAGNOSIS — G4733 Obstructive sleep apnea (adult) (pediatric): Secondary | ICD-10-CM | POA: Diagnosis not present

## 2020-08-30 ENCOUNTER — Other Ambulatory Visit: Payer: Self-pay

## 2020-08-30 DIAGNOSIS — R030 Elevated blood-pressure reading, without diagnosis of hypertension: Secondary | ICD-10-CM | POA: Diagnosis not present

## 2020-08-31 ENCOUNTER — Ambulatory Visit (INDEPENDENT_AMBULATORY_CARE_PROVIDER_SITE_OTHER): Payer: BC Managed Care – PPO | Admitting: Family Medicine

## 2020-08-31 ENCOUNTER — Encounter: Payer: Self-pay | Admitting: Family Medicine

## 2020-08-31 VITALS — BP 130/80 | HR 73 | Temp 96.8°F | Ht 69.0 in | Wt 220.0 lb

## 2020-08-31 DIAGNOSIS — K429 Umbilical hernia without obstruction or gangrene: Secondary | ICD-10-CM

## 2020-08-31 DIAGNOSIS — Z0000 Encounter for general adult medical examination without abnormal findings: Secondary | ICD-10-CM

## 2020-08-31 DIAGNOSIS — G4733 Obstructive sleep apnea (adult) (pediatric): Secondary | ICD-10-CM

## 2020-08-31 DIAGNOSIS — R7303 Prediabetes: Secondary | ICD-10-CM

## 2020-08-31 DIAGNOSIS — E785 Hyperlipidemia, unspecified: Secondary | ICD-10-CM | POA: Diagnosis not present

## 2020-08-31 DIAGNOSIS — Z1211 Encounter for screening for malignant neoplasm of colon: Secondary | ICD-10-CM | POA: Diagnosis not present

## 2020-08-31 DIAGNOSIS — I10 Essential (primary) hypertension: Secondary | ICD-10-CM

## 2020-08-31 DIAGNOSIS — Z9989 Dependence on other enabling machines and devices: Secondary | ICD-10-CM

## 2020-08-31 DIAGNOSIS — M5030 Other cervical disc degeneration, unspecified cervical region: Secondary | ICD-10-CM

## 2020-08-31 DIAGNOSIS — E559 Vitamin D deficiency, unspecified: Secondary | ICD-10-CM

## 2020-08-31 DIAGNOSIS — R011 Cardiac murmur, unspecified: Secondary | ICD-10-CM

## 2020-08-31 NOTE — Assessment & Plan Note (Signed)
Chronic, stable. Continue to avoid added sugars in diet.  
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
## 2020-08-31 NOTE — Assessment & Plan Note (Signed)
On CPAP followed by Dr Welton Flakes

## 2020-08-31 NOTE — Assessment & Plan Note (Signed)
Well controlled with daily replacement.

## 2020-08-31 NOTE — Assessment & Plan Note (Signed)
Not appreciated today.  

## 2020-08-31 NOTE — Patient Instructions (Addendum)
Considere tomar turmeric que es anti-inflamatorio.  Recoger examen de heces al laboratorio hoy.  Mandeme fechas de vacuna de [place]. Regresar en 1 ao para proximo examen fisico  Mantenimiento de Research officer, political party, Male Adoptar un estilo de vida saludable y recibir atencin preventiva son importantes para promover la salud y Counsellor. Consulte al mdico sobre:  El esquema adecuado para hacerse pruebas y exmenes peridicos.  Cosas que puede hacer por su cuenta para prevenir enfermedades y [place] sano. Qu debo saber sobre la dieta, el peso y el ejercicio? Consuma una dieta saludable   Consuma una dieta que incluya muchas verduras, frutas, productos lcteos con bajo contenido de [place] y Associate Professor.  No consuma muchos alimentos ricos en grasas slidas, azcares agregados o sodio. Mantenga un peso saludable El ndice de masa muscular [facility]) es una medida que puede utilizarse para identificar posibles problemas de [place]. Proporciona una estimacin de la grasa corporal basndose en el peso y la altura. Su mdico puede ayudarle a Engineer, site IMC y a Personnel officer o Pharmacologist un peso saludable. Haga ejercicio con regularidad Haga ejercicio con regularidad. Esta es una de las prcticas ms importantes que puede hacer por su salud. La mayora de los adultos deben seguir estas pautas:  Education officer, environmental, al menos, de actividad fsica por semana. El ejercicio debe aumentar la frecuencia cardaca y Media planner transpirar (ejercicio de intensidad moderada).  Hacer ejercicios de fortalecimiento por lo [employer] por semana. Agregue esto a su plan de ejercicio de intensidad moderada.  Pasar menos tiempo sentados. Incluso la actividad fsica ligera puede ser beneficiosa. Controle sus niveles de colesterol y lpidos en la sangre Comience a realizarse anlisis de lpidos y Oncologist en la sangre a los 20aos y luego reptalos cada 5aos. Es posible que Optometrist los niveles de colesterol con mayor frecuencia si:  Sus niveles de lpidos y colesterol son altos.  Es mayor de 40aos.  Presenta un alto riesgo de padecer enfermedades cardacas. Qu debo saber sobre las pruebas de deteccin del cncer? Muchos tipos de cncer pueden detectarse de manera temprana y, a menudo, pueden prevenirse. Segn su historia clnica y sus antecedentes familiares, es posible que deba realizarse pruebas de deteccin del cncer en diferentes edades. Esto puede incluir pruebas de deteccin de lo siguiente:  Building services engineer.  Cncer de prstata.  Cncer de piel.  Cncer de pulmn. Qu debo saber sobre la enfermedad cardaca, la diabetes y la hipertensin arterial? Presin arterial y enfermedad cardaca  La hipertensin arterial causa enfermedades cardacas y [place] el riesgo de accidente cerebrovascular. Es ms probable que esto se manifieste en las personas que tienen lecturas de presin arterial alta, tienen ascendencia africana o tienen sobrepeso.  Hable con el mdico sobre sus valores de presin arterial deseados.  Hgase controlar la presin arterial: ? Cada 3 a 5 aos si tiene entre 18 y 76 aos. ? Todos los aos si es mayor de [place].  Si tiene entre 65 y 76 aos y es fumador o Insurance underwriter, pregntele al mdico si debe realizarse una prueba de deteccin de aneurisma artico abdominal (AAA) por nica vez. Diabetes Realcese exmenes de deteccin de la diabetes con regularidad. Este anlisis revisa el nivel de azcar en la sangre en [place]. Hgase las pruebas de deteccin:  Cada tresaos despus de los 45aos de edad si tiene un peso normal y un bajo riesgo de padecer diabetes.  Con ms frecuencia y a partir de [place] edad inferior si tiene  sobrepeso o un alto riesgo de padecer diabetes. Qu debo saber sobre la prevencin de infecciones? Hepatitis B Si tiene un riesgo ms alto de contraer hepatitis B, debe someterse a un examen de deteccin  de este virus. Hable con el mdico para averiguar si tiene riesgo de contraer la infeccin por hepatitis B. Hepatitis C Se recomienda un anlisis de [place] para:  Todos los que nacieron entre 1946 y [phone].  Todas las personas que tengan un riesgo de haber contrado hepatitis C. Enfermedades de transmisin sexual (ETS)  Debe realizarse pruebas de deteccin de ITS todos los aos, incluidas la gonorrea y la clamidia, si: ? Es sexualmente activo y es menor de 24aos. ? Es mayor de 24aos, y Public affairs consultant informa que corre riesgo de tener este tipo de infecciones. ? La actividad sexual ha cambiado desde que le hicieron la ltima prueba de deteccin y tiene un riesgo mayor de Warehouse manager clamidia o Copy. Pregntele al mdico si usted tiene riesgo.  Pregntele al mdico si usted tiene un alto riesgo de Primary school teacher VIH. El mdico tambin puede recomendarle un medicamento recetado para ayudar a evitar la infeccin por el VIH. Si elige tomar medicamentos para prevenir el VIH, primero debe [employer] de deteccin del VIH. Luego debe hacerse anlisis cada mientras est tomando los medicamentos. Siga estas instrucciones en su casa: Estilo de vida  No consuma ningn producto que contenga nicotina o tabaco, como cigarrillos, cigarrillos electrnicos y tabaco de Theatre manager. Si necesita ayuda para dejar de fumar, consulte al mdico.  No consuma drogas.  No comparta agujas.  Solicite ayuda a su mdico si necesita apoyo o informacin para abandonar las drogas. Consumo de alcohol  No beba alcohol si el mdico se lo prohbe.  Si bebe alcohol: ? Limite la cantidad que consume de 0 a 2 medidas por da. ? Est atento a la cantidad de alcohol que hay en las bebidas que toma. En los [place], una medida equivale a una botella de cerveza de 12oz ( ), un vaso de vino de 5oz ( ) o un vaso de una bebida alcohlica de alta graduacin de 1oz (51ml). Instrucciones generales  Realcese  los estudios de rutina de la salud, dentales y de Wellsite geologist.  Mantngase al da con las vacunas.  Infrmele a su mdico si: ? Se siente deprimido con frecuencia. ? Alguna vez ha sido vctima de [place] o no se siente seguro en su casa. Resumen  Adoptar un estilo de vida saludable y recibir atencin preventiva son importantes para promover la salud y Counsellor.  Siga las instrucciones del mdico acerca de una dieta saludable, el ejercicio y la realizacin de pruebas o exmenes para Hotel manager.  Siga las instrucciones del mdico con respecto al control del colesterol y la presin arterial. Esta informacin no tiene Theme park manager el consejo del mdico. Asegrese de hacerle al mdico cualquier pregunta que tenga. Document Revised: 09/01/2018 Document Reviewed: 09/01/2018 Elsevier Patient Education  2020 [employer].

## 2020-08-31 NOTE — Assessment & Plan Note (Signed)
Preventative protocols reviewed and updated unless pt declined. Discussed healthy diet and lifestyle.  

## 2020-08-31 NOTE — Assessment & Plan Note (Signed)
Chronic, overall good readings from home (scanned). Continue losartan 25mg  daily.

## 2020-08-31 NOTE — Assessment & Plan Note (Signed)
Chronic, markedly elevated off statin. Encouraged renewed efforts at low chol diet. If persistently elevated, discussed possibly starting statin.  The 10-year ASCVD risk score Denman George DC Montez Hageman., et al., 2013) is: 12.1%*   Values used to calculate the score:     Age: 55 years     Sex: Male     Is Non-Hispanic African American: No     Diabetic: No     Tobacco smoker: No     Systolic Blood Pressure: 158 mmHg     Is BP treated: Yes     HDL Cholesterol: 51 mg/dL*     Total Cholesterol: 254 mg/dL*     * - Cholesterol units were assumed for this score calculation

## 2020-08-31 NOTE — Assessment & Plan Note (Signed)
Discussed continue green vegetable intake, trial turmeric 500mg  bid, consider muscle relaxant PRN. meloxicam caused elevated BP and dizziness.

## 2020-09-03 DIAGNOSIS — K429 Umbilical hernia without obstruction or gangrene: Secondary | ICD-10-CM | POA: Insufficient documentation

## 2020-09-03 NOTE — Assessment & Plan Note (Signed)
asxs at this time. Reviewed indications for gen surg referral.

## 2020-09-10 DIAGNOSIS — G4733 Obstructive sleep apnea (adult) (pediatric): Secondary | ICD-10-CM | POA: Diagnosis not present

## 2020-09-18 ENCOUNTER — Other Ambulatory Visit (INDEPENDENT_AMBULATORY_CARE_PROVIDER_SITE_OTHER): Payer: BC Managed Care – PPO

## 2020-09-18 DIAGNOSIS — Z1211 Encounter for screening for malignant neoplasm of colon: Secondary | ICD-10-CM | POA: Diagnosis not present

## 2020-09-18 LAB — FECAL OCCULT BLOOD, IMMUNOCHEMICAL: Fecal Occult Bld: NEGATIVE

## 2020-09-18 LAB — FECAL OCCULT BLOOD, GUAIAC: Fecal Occult Blood: NEGATIVE

## 2020-09-19 ENCOUNTER — Encounter: Payer: Self-pay | Admitting: Family Medicine

## 2020-09-20 DIAGNOSIS — R030 Elevated blood-pressure reading, without diagnosis of hypertension: Secondary | ICD-10-CM | POA: Diagnosis not present

## 2020-09-24 ENCOUNTER — Other Ambulatory Visit: Payer: Self-pay | Admitting: Family Medicine

## 2020-09-25 NOTE — Telephone Encounter (Signed)
Pharmacy requests refill on: Losartan 25 mg   LAST REFILL: 06/26/2020 (Q-90, R-0) LAST OV: 08/31/2020 NEXT OV: Not Scheduled  PHARMACY: [id number] [place], [place]

## 2020-10-04 DIAGNOSIS — R030 Elevated blood-pressure reading, without diagnosis of hypertension: Secondary | ICD-10-CM | POA: Diagnosis not present
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
## 2020-10-11 DIAGNOSIS — G4733 Obstructive sleep apnea (adult) (pediatric): Secondary | ICD-10-CM | POA: Diagnosis not present

## 2020-11-08 DIAGNOSIS — R030 Elevated blood-pressure reading, without diagnosis of hypertension: Secondary | ICD-10-CM | POA: Diagnosis not present

## 2020-11-08 DIAGNOSIS — G4733 Obstructive sleep apnea (adult) (pediatric): Secondary | ICD-10-CM | POA: Diagnosis not present

## 2020-11-15 DIAGNOSIS — R030 Elevated blood-pressure reading, without diagnosis of hypertension: Secondary | ICD-10-CM | POA: Diagnosis not present

## 2020-11-15 DIAGNOSIS — G4733 Obstructive sleep apnea (adult) (pediatric): Secondary | ICD-10-CM | POA: Diagnosis not present

## 2020-12-09 DIAGNOSIS — G4733 Obstructive sleep apnea (adult) (pediatric): Secondary | ICD-10-CM | POA: Diagnosis not present

## 2020-12-10 ENCOUNTER — Telehealth: Payer: Self-pay

## 2020-12-10 ENCOUNTER — Other Ambulatory Visit: Payer: Self-pay

## 2020-12-10 ENCOUNTER — Ambulatory Visit (INDEPENDENT_AMBULATORY_CARE_PROVIDER_SITE_OTHER): Payer: BC Managed Care – PPO | Admitting: Internal Medicine

## 2020-12-10 ENCOUNTER — Encounter: Payer: Self-pay | Admitting: Internal Medicine

## 2020-12-10 VITALS — BP 130/86 | HR 99 | Temp 97.2°F | Resp 16 | Ht 71.0 in | Wt 206.0 lb

## 2020-12-10 DIAGNOSIS — G4733 Obstructive sleep apnea (adult) (pediatric): Secondary | ICD-10-CM

## 2020-12-10 DIAGNOSIS — Z72821 Inadequate sleep hygiene: Secondary | ICD-10-CM

## 2020-12-10 DIAGNOSIS — Z7189 Other specified counseling: Secondary | ICD-10-CM
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
## 2020-12-10 NOTE — Telephone Encounter (Signed)
Confirmation of verbal order signed by provider and placed in [facility] patient folder. Sweta Halseth 

## 2020-12-10 NOTE — Patient Instructions (Signed)

## 2020-12-13 DIAGNOSIS — Z0130 Encounter for examination of blood pressure without abnormal findings: Secondary | ICD-10-CM | POA: Diagnosis not present

## 2020-12-26 DIAGNOSIS — Z0189 Encounter for other specified special examinations: Secondary | ICD-10-CM | POA: Diagnosis not present

## 2020-12-26 LAB — LIPID PANEL
Cholesterol: 236 — AB (ref 0–200)
HDL: 70 (ref 35–70)
LDL Cholesterol: 149
Triglycerides: 99 (ref 40–160)

## 2020-12-26 LAB — CBC AND DIFFERENTIAL
Hemoglobin: 16.8 (ref 13.5–17.5)
Platelets: 252 (ref 150–399)
WBC: 6.7

## 2020-12-26 LAB — TSH: TSH: 2.3 (ref 0.41–5.90)

## 2020-12-26 LAB — HEPATIC FUNCTION PANEL
ALT: 34 (ref 10–40)
AST: 37 (ref 14–40)
Alkaline Phosphatase: 57 (ref 25–125)
Bilirubin, Total: 1.3

## 2020-12-26 LAB — HEMOGLOBIN A1C: Hemoglobin A1C: 5.9

## 2020-12-26 LAB — BASIC METABOLIC PANEL
Creatinine: 1.1 (ref 0.6–1.3)
Glucose: 75
Potassium: 4.1 (ref 3.4–5.3)
Sodium: 140 (ref 137–147)

## 2020-12-26 LAB — VITAMIN D 25 HYDROXY (VIT D DEFICIENCY, FRACTURES): Vit D, 25-Hydroxy: 27.4

## 2021-01-02 DIAGNOSIS — E785 Hyperlipidemia, unspecified: Secondary | ICD-10-CM | POA: Diagnosis not present

## 2021-01-02 DIAGNOSIS — Z713 Dietary counseling and surveillance: Secondary | ICD-10-CM | POA: Diagnosis not present

## 2021-01-02 DIAGNOSIS — Z043 Encounter for examination and observation following other accident: Secondary | ICD-10-CM | POA: Diagnosis not present

## 2021-01-08 ENCOUNTER — Encounter: Payer: Self-pay | Admitting: Family Medicine

## 2021-01-08 DIAGNOSIS — G4733 Obstructive sleep apnea (adult) (pediatric): Secondary | ICD-10-CM | POA: Diagnosis not present

## 2021-01-30 DIAGNOSIS — Z1152 Encounter for screening for COVID-19: Secondary | ICD-10-CM | POA: Diagnosis not present

## 2021-02-05 ENCOUNTER — Other Ambulatory Visit: Payer: Self-pay | Admitting: Family Medicine

## 2021-02-06 DIAGNOSIS — Z0131 Encounter for examination of blood pressure with abnormal findings: Secondary | ICD-10-CM | POA: Diagnosis not present

## 2021-02-07 DIAGNOSIS — R030 Elevated blood-pressure reading, without diagnosis of hypertension: Secondary | ICD-10-CM | POA: Diagnosis not present

## 2021-02-08 DIAGNOSIS — G4733 Obstructive sleep apnea (adult) (pediatric): Secondary | ICD-10-CM | POA: Diagnosis not present

## 2021-02-22 DIAGNOSIS — Z1152 Encounter for screening for COVID-19: Secondary | ICD-10-CM | POA: Diagnosis not present

## 2021-02-27 DIAGNOSIS — I10 Essential (primary) hypertension: Secondary | ICD-10-CM | POA: Diagnosis not present

## 2021-03-06 DIAGNOSIS — I10 Essential (primary) hypertension: Secondary | ICD-10-CM | POA: Diagnosis not present

## 2021-03-10 DIAGNOSIS — G4733 Obstructive sleep apnea (adult) (pediatric): Secondary | ICD-10-CM | POA: Diagnosis not present

## 2021-03-14 DIAGNOSIS — Z0130 Encounter for examination of blood pressure without abnormal findings: Secondary | ICD-10-CM | POA: Diagnosis not present

## 2021-03-20 DIAGNOSIS — I10 Essential (primary) hypertension: Secondary | ICD-10-CM | POA: Diagnosis not present

## 2021-03-28 DIAGNOSIS — Z0130 Encounter for examination of blood pressure without abnormal findings: Secondary | ICD-10-CM | POA: Diagnosis not present

## 2021-04-10 DIAGNOSIS — G4733 Obstructive sleep apnea (adult) (pediatric): Secondary | ICD-10-CM | POA: Diagnosis not present

## 2021-04-18 DIAGNOSIS — R030 Elevated blood-pressure reading, without diagnosis of hypertension: Secondary | ICD-10-CM | POA: Diagnosis not present

## 2021-04-25 DIAGNOSIS — R030 Elevated blood-pressure reading, without diagnosis of hypertension: Secondary | ICD-10-CM | POA: Diagnosis not present

## 2021-05-01 DIAGNOSIS — I10 Essential (primary) hypertension: Secondary | ICD-10-CM | POA: Diagnosis not present

## 2021-05-02 DIAGNOSIS — R030 Elevated blood-pressure reading, without diagnosis of hypertension: Secondary | ICD-10-CM | POA: Diagnosis not present

## 2021-05-09 DIAGNOSIS — G4733 Obstructive sleep apnea (adult) (pediatric): Secondary | ICD-10-CM | POA: Diagnosis not present

## 2021-05-29 DIAGNOSIS — I10 Essential (primary) hypertension: Secondary | ICD-10-CM | POA: Diagnosis not present

## 2021-05-30 DIAGNOSIS — R030 Elevated blood-pressure reading, without diagnosis of hypertension: Secondary | ICD-10-CM | POA: Diagnosis not present

## 2021-06-10 DIAGNOSIS — G4733 Obstructive sleep apnea (adult) (pediatric): Secondary | ICD-10-CM | POA: Diagnosis not present

## 2021-06-26 DIAGNOSIS — R21 Rash and other nonspecific skin eruption: Secondary | ICD-10-CM | POA: Diagnosis not present

## 2021-06-26 DIAGNOSIS — I10 Essential (primary) hypertension: Secondary | ICD-10-CM | POA: Diagnosis not present

## 2021-07-10 DIAGNOSIS — G4733 Obstructive sleep apnea (adult) (pediatric): Secondary | ICD-10-CM | POA: Diagnosis not present

## 2021-07-11 DIAGNOSIS — R030 Elevated blood-pressure reading, without diagnosis of hypertension: Secondary | ICD-10-CM | POA: Diagnosis not present

## 2021-07-31 DIAGNOSIS — Z0131 Encounter for examination of blood pressure with abnormal findings: Secondary | ICD-10-CM | POA: Diagnosis not present

## 2021-08-07 DIAGNOSIS — Z0131 Encounter for examination of blood pressure with abnormal findings: Secondary | ICD-10-CM | POA: Diagnosis not present

## 2021-08-09 DIAGNOSIS — G4733 Obstructive sleep apnea (adult) (pediatric): Secondary | ICD-10-CM | POA: Diagnosis not present

## 2021-09-09 DIAGNOSIS — G4733 Obstructive sleep apnea (adult) (pediatric): Secondary | ICD-10-CM | POA: Diagnosis not present

## 2021-09-11 DIAGNOSIS — Z0131 Encounter for examination of blood pressure with abnormal findings: Secondary | ICD-10-CM | POA: Diagnosis not present

## 2021-10-09 DIAGNOSIS — G4733 Obstructive sleep apnea (adult) (pediatric): Secondary | ICD-10-CM | POA: Diagnosis not present

## 2021-10-10 DIAGNOSIS — R030 Elevated blood-pressure reading, without diagnosis of hypertension: Secondary | ICD-10-CM | POA: Diagnosis not present

## 2021-10-16 DIAGNOSIS — Z0189 Encounter for other specified special examinations: Secondary | ICD-10-CM | POA: Diagnosis not present

## 2021-10-16 LAB — HEPATIC FUNCTION PANEL
ALT: 32 U/L (ref 10–40)
AST: 26 (ref 14–40)
Alkaline Phosphatase: 54 (ref 25–125)
Bilirubin, Total: 0.9

## 2021-10-16 LAB — LIPID PANEL
Cholesterol: 251 — AB (ref 0–200)
HDL: 57 (ref 35–70)
LDL Cholesterol: 173
Triglycerides: 117 (ref 40–160)

## 2021-10-16 LAB — CBC AND DIFFERENTIAL
Hemoglobin: 17.3 (ref 13.5–17.5)
Platelets: 261 10*3/uL (ref 150–400)
WBC: 6.3

## 2021-10-16 LAB — HEMOGLOBIN A1C: Hemoglobin A1C: 5.6

## 2021-10-16 LAB — TSH: TSH: 1.54 (ref 0.41–5.90)

## 2021-10-16 LAB — VITAMIN D 25 HYDROXY (VIT D DEFICIENCY, FRACTURES): Vit D, 25-Hydroxy: 32.5

## 2021-10-16 LAB — BASIC METABOLIC PANEL
Creatinine: 1.1 (ref 0.6–1.3)
Glucose: 81
Potassium: 4.5 mEq/L (ref 3.5–5.1)
Sodium: 139 (ref 137–147)

## 2021-10-16 LAB — VITAMIN B12: Vitamin B-12: 744

## 2021-10-16 LAB — PSA: PSA: 0.9

## 2021-10-17 DIAGNOSIS — R030 Elevated blood-pressure reading, without diagnosis of hypertension: Secondary | ICD-10-CM | POA: Diagnosis not present

## 2021-10-23 DIAGNOSIS — E785 Hyperlipidemia, unspecified: Secondary | ICD-10-CM | POA: Diagnosis not present

## 2021-10-23 DIAGNOSIS — Z043 Encounter for examination and observation following other accident: Secondary | ICD-10-CM | POA: Diagnosis not present

## 2021-10-23 DIAGNOSIS — Z713 Dietary counseling and surveillance: Secondary | ICD-10-CM | POA: Diagnosis not present

## 2021-10-30 DIAGNOSIS — I10 Essential (primary) hypertension: Secondary | ICD-10-CM | POA: Diagnosis not present

## 2021-10-31 ENCOUNTER — Other Ambulatory Visit: Payer: Self-pay | Admitting: Family Medicine

## 2021-11-08 DIAGNOSIS — G4733 Obstructive sleep apnea (adult) (pediatric): Secondary | ICD-10-CM | POA: Diagnosis not present

## 2021-11-13 DIAGNOSIS — R21 Rash and other nonspecific skin eruption: Secondary | ICD-10-CM | POA: Diagnosis not present

## 2021-11-13 DIAGNOSIS — I10 Essential (primary) hypertension: Secondary | ICD-10-CM | POA: Diagnosis not present

## 2021-11-25 ENCOUNTER — Encounter: Payer: Self-pay | Admitting: Family Medicine

## 2021-11-25 ENCOUNTER — Ambulatory Visit (INDEPENDENT_AMBULATORY_CARE_PROVIDER_SITE_OTHER): Payer: BC Managed Care – PPO | Admitting: Family Medicine

## 2021-11-25 VITALS — BP 140/78 | HR 93 | Temp 97.6°F | Ht 70.5 in | Wt 208.0 lb

## 2021-11-25 DIAGNOSIS — R7303 Prediabetes: Secondary | ICD-10-CM

## 2021-11-25 DIAGNOSIS — I10 Essential (primary) hypertension: Secondary | ICD-10-CM

## 2021-11-25 DIAGNOSIS — K429 Umbilical hernia without obstruction or gangrene: Secondary | ICD-10-CM

## 2021-11-25 DIAGNOSIS — E785 Hyperlipidemia, unspecified: Secondary | ICD-10-CM | POA: Diagnosis not present

## 2021-11-25 DIAGNOSIS — G4733 Obstructive sleep apnea (adult) (pediatric): Secondary | ICD-10-CM

## 2021-11-25 DIAGNOSIS — F419 Anxiety disorder, unspecified: Secondary | ICD-10-CM

## 2021-11-25 DIAGNOSIS — Z0000 Encounter for general adult medical examination without abnormal findings: Secondary | ICD-10-CM | POA: Diagnosis not present

## 2021-11-25 DIAGNOSIS — Z23 Encounter for immunization: Secondary | ICD-10-CM

## 2021-11-25 DIAGNOSIS — Z1211 Encounter for screening for malignant neoplasm of colon: Secondary | ICD-10-CM

## 2021-11-25 DIAGNOSIS — Z9989 Dependence on other enabling machines and devices: Secondary | ICD-10-CM

## 2021-11-25 DIAGNOSIS — E559 Vitamin D deficiency, unspecified: Secondary | ICD-10-CM

## 2021-11-25 DIAGNOSIS — R011 Cardiac murmur, unspecified: Secondary | ICD-10-CM

## 2021-11-25 MED ORDER — LOSARTAN POTASSIUM 50 MG PO TABS: 50.0000 mg | ORAL_TABLET | Freq: Every day | ORAL | 3 refills | Status: DC

## 2021-11-25 MED ORDER — SERTRALINE HCL 25 MG PO TABS: 25.0000 mg | ORAL_TABLET | Freq: Every day | ORAL | 11 refills | Status: DC

## 2021-11-25 MED ORDER — TRIAMCINOLONE ACETONIDE 0.1 % EX CREA: 1.0000 "application " | TOPICAL_CREAM | Freq: Two times a day (BID) | CUTANEOUS | 0 refills | Status: AC

## 2021-11-25 NOTE — Progress Notes (Signed)
? ? Patient ID: Jesse Phillips, male    DOB: [birth year], 56 y.o.   MRN: [id number] ? ?This visit was conducted in person. ? ?BP 140/78   Pulse 93   Temp 97.6 ?F (36.4 ?C) (Temporal)   Ht 5' 10.5" (1.791 m)   Wt 208 lb (94.3 kg)   SpO2 98%   BMI 29.42 kg/m?   ?150/86 on retesting ? ?CC: CPE ?Subjective:  ? ?HPI: ?Jesse Phillips is a 56 y.o. male presenting on 11/25/2021 for Annual Exam ? ? ?Brings labs from work (gets Q6 mo).  ? ?OSA on CPAP followed by Dr Humphrey Rolls. He continues using CPAP.  ? ?Brings work BP log showing good control - 120-140/80s. He continues losartan. Notes BP readings elevated at office visits, but always normal on nurse checks. No HA, vision changes, CP/tightness, SOB, leg swelling.  ? ?High stress at work. Notes social anxiety, some agoraphobia ie at church he needs to position himself at the back of the building.  ? ?Neck pain has improved over the last few months.   ? ?Preventative:  ?Colon cancer screening - iFOB negative yearly. Would like to continue.  ?Prostate cancer screening - brother with prostate cancer age 54. Completes screening yearly at work.  ?Lung cancer screening - not eligible  ?Flu shot yearly  ?COVID vaccine - Moderna 12/2019, 01/2020, booster 09/2020 ?Tdap 10/2014  ?Completed Hep B series ?Shingrix - discussed, interested  ?Seat belt use discussed  ?Sunscreen use discussed. No changing mole on skin.  ?Non smoker  ?Alcohol - rare beer  ?Dentist yearly  ?Eye exam yearly  ? ?Lives with Derrick Ravel wife and 1 son  ?Occupation: Glass blower/designer 5am to [employer] ?From [place]  ?Edu: HS  ?Activity: occasionally gym, walking regularly  ?Diet: wife cooks healthy, good water  ?   ? ?Relevant past medical, surgical, family and social history reviewed and updated as indicated. Interim medical history since our last visit reviewed. ?Allergies and medications reviewed and updated. ?Outpatient Medications Prior to Visit  ?Medication Sig Dispense Refill  ? Ascorbic Acid (VITAMIN C) 500 MG CAPS Take 1  capsule by mouth daily.    ? Cholecalciferol (VITAMIN D) 2000 UNITS CAPS Take 1 capsule by mouth daily.    ? Multiple Vitamins-Minerals (CENTRUM ADULTS PO) Take 1 tablet by mouth daily.    ? OXYGEN Inhale into the lungs. Uses concentrator on Cpap at night    ? losartan (COZAAR) 25 MG tablet Take 1 tablet by mouth once daily 90 tablet 0  ? ?No facility-administered medications prior to visit.  ?  ? ?Per HPI unless specifically indicated in ROS section below ?Review of Systems  ?Constitutional:  Negative for activity change, appetite change, chills, fatigue, fever and unexpected weight change.  ?HENT:  Negative for hearing loss.   ?Eyes:  Negative for visual disturbance.  ?Respiratory:  Negative for cough, chest tightness, shortness of breath and wheezing.   ?Cardiovascular:  Negative for chest pain, palpitations and leg swelling.  ?Gastrointestinal:  Negative for abdominal distention, abdominal pain, blood in stool, constipation, diarrhea, nausea and vomiting.  ?Genitourinary:  Negative for difficulty urinating and hematuria.  ?Musculoskeletal:  Negative for arthralgias, myalgias and neck pain.  ?Skin:  Negative for rash.  ?Neurological:  Negative for dizziness, seizures, syncope and headaches.  ?Hematological:  Negative for adenopathy. Does not bruise/bleed easily.  ?Psychiatric/Behavioral:  Negative for dysphoric mood. The patient is not nervous/anxious.   ? ?Objective:  ?BP 140/78   Pulse 93   Temp 97.6 ?F (  36.4 ?C) (Temporal)   Ht 5' 10.5" (1.791 m)   Wt 208 lb (94.3 kg)   SpO2 98%   BMI 29.42 kg/m?   ?Wt Readings from Last 3 Encounters:  ?11/25/21 208 lb (94.3 kg)  ?12/10/20 206 lb (93.4 kg)  ?08/31/20 220 lb (99.8 kg)  ?  ?  ?Physical Exam ?Vitals and nursing note reviewed.  ?Constitutional:   ?   General: He is not in acute distress. ?   Appearance: Normal appearance. He is well-developed. He is not ill-appearing.  ?HENT:  ?   Head: Normocephalic and atraumatic.  ?   Right Ear: Hearing, tympanic  membrane, ear canal and external ear normal.  ?   Left Ear: Hearing, tympanic membrane, ear canal and external ear normal.  ?Eyes:  ?   General: No scleral icterus. ?   Extraocular Movements: Extraocular movements intact.  ?   Conjunctiva/sclera: Conjunctivae normal.  ?   Pupils: Pupils are equal, round, and reactive to light.  ?Neck:  ?   Thyroid: No thyroid mass or thyromegaly.  ?Cardiovascular:  ?   Rate and Rhythm: Normal rate and regular rhythm.  ?   Pulses: Normal pulses.     ?     Radial pulses are 2+ on the right side and 2+ on the left side.  ?   Heart sounds: Murmur (2/6 systolic RUSB) heard.  ?Pulmonary:  ?   Effort: Pulmonary effort is normal. No respiratory distress.  ?   Breath sounds: Normal breath sounds. No wheezing, rhonchi or rales.  ?Abdominal:  ?   General: Bowel sounds are normal. There is no distension.  ?   Palpations: Abdomen is soft. There is no mass.  ?   Tenderness: There is no abdominal tenderness. There is no guarding or rebound.  ?   Hernia: A hernia is present. Hernia is present in the umbilical area (reducible).  ?Musculoskeletal:     ?   General: Normal range of motion.  ?   Cervical back: Normal range of motion and neck supple.  ?   Right lower leg: No edema.  ?   Left lower leg: No edema.  ?Lymphadenopathy:  ?   Cervical: No cervical adenopathy.  ?Skin: ?   General: Skin is warm and dry.  ?   Findings: No rash.  ?Neurological:  ?   General: No focal deficit present.  ?   Mental Status: He is alert and oriented to person, place, and time.  ?Psychiatric:     ?   Mood and Affect: Mood normal.     ?   Behavior: Behavior normal.     ?   Thought Content: Thought content normal.     ?   Judgment: Judgment normal.  ? ?   ?Results for orders placed or performed in visit on 01/08/21  ?Basic metabolic panel  ?Result Value Ref Range  ? Glucose 75   ?CBC and differential  ?Result Value Ref Range  ? Hemoglobin 16.8 13.5 - 17.5  ? Platelets 252 150 - 399  ? WBC 6.7   ?VITAMIN D 25 Hydroxy (Vit-D  Deficiency, Fractures)  ?Result Value Ref Range  ? Vit D, 25-Hydroxy 27.4   ?Basic metabolic panel  ?Result Value Ref Range  ? Creatinine 1.1 0.6 - 1.3  ? Potassium 4.1 3.4 - 5.3  ? Sodium 140 137 - 147  ?Lipid panel  ?Result Value Ref Range  ? Triglycerides 99 40 - 160  ? Cholesterol 236 (A) 0 -  200  ? HDL 70 35 - 70  ? LDL Cholesterol 149   ?Hepatic function panel  ?Result Value Ref Range  ? Alkaline Phosphatase 57 25 - 125  ? ALT 34 10 - 40  ? AST 37 14 - 40  ? Bilirubin, Total 1.3   ?Hemoglobin A1c  ?Result Value Ref Range  ? Hemoglobin A1C 5.9   ?TSH  ?Result Value Ref Range  ? TSH 2.30 0.41 - 5.90  ? ? ?  11/25/2021  ?  8:47 AM 03/22/2020  ? 10:31 AM 08/12/2019  ?  7:37 AM 09/21/2018  ? 12:52 PM 08/06/2018  ?  7:14 AM  ?Depression screen PHQ 2/9  ?Decreased Interest 1 0 1 0 0  ?Down, Depressed, Hopeless 1 0 1 1 0  ?PHQ - 2 Score 2 0 2 1 0  ?Altered sleeping 0  0 1   ?Tired, decreased energy 1  0 1   ?Change in appetite 0  0 0   ?Feeling bad or failure about yourself  0  1 1   ?Trouble concentrating 1  0 1   ?Moving slowly or fidgety/restless 1  0 1   ?Suicidal thoughts 0  0 0   ?PHQ-9 Score 5  3 6    ?Difficult doing work/chores Somewhat difficult      ?  ? ?  11/25/2021  ?  8:47 AM 08/12/2019  ?  7:38 AM 09/21/2018  ? 12:53 PM  ?GAD 7 : Generalized Anxiety Score  ?Nervous, Anxious, on Edge 2 1 1   ?Control/stop worrying 1 0 1  ?Worry too much - different things 2 2 1   ?Trouble relaxing 1 2 1   ?Restless 1 1 1   ?Easily annoyed or irritable 1 0 0  ?Afraid - awful might happen 2 2 2   ?Total GAD 7 Score 10 8 7   ?Anxiety Difficulty Somewhat difficult    ? ?Assessment & Plan:  ? ?Problem List Items Addressed This Visit   ? ? Health maintenance examination - Primary (Chronic)  ?  Preventative protocols reviewed and updated unless pt declined. ?Discussed healthy diet and lifestyle.  ?  ?  ? HLD (hyperlipidemia)  ?  Chronic, deteriorated. Reviewed diet choices to improve LDL. Consider statin if persistently elevated.  ?The  10-year ASCVD risk score (Arnett DK, et al., 2019) is: 7.5%* ?  Values used to calculate the score: ?    Age: 40 years ?    Sex: Male ?    Is Non-Hispanic African American: No ?    Diabetic: No ?    Tobacco smoker: No ?    Systo

## 2021-11-25 NOTE — Assessment & Plan Note (Signed)
Again heard today.  

## 2021-11-25 NOTE — Assessment & Plan Note (Signed)
Preventative protocols reviewed and updated unless pt declined. Discussed healthy diet and lifestyle.  

## 2021-11-25 NOTE — Assessment & Plan Note (Signed)
Continue vit D replacement daily.  

## 2021-11-25 NOTE — Assessment & Plan Note (Signed)
A1c has improved to normal range.  ?

## 2021-11-25 NOTE — Assessment & Plan Note (Signed)
Continues CPAP through Dr Welton Flakes.  ?

## 2021-11-25 NOTE — Assessment & Plan Note (Addendum)
BP remains borderline high - will increase losartan to 50mg  daily. Pt agrees with plan and will monitor for low BP readings or symptoms.  ?

## 2021-11-25 NOTE — Assessment & Plan Note (Signed)
Notes ongoing difficulty with this- will trial sertraline 25mg  daily, update with effect.  ?

## 2021-11-25 NOTE — Assessment & Plan Note (Signed)
Discussed with patient. Consider gen surg eval.  ?

## 2021-11-25 NOTE — Patient Instructions (Addendum)
Shingrix hoy. Regresar en 2-6 meses para proxima vacuna.  ?Pase por laboratorio para examen de heces ocultas.  ?Use crema para la piel para prevenir rasqui?a (aveeno, eucerin, cerava). He rellenado crema de triamcinolone corticosteroide - use limitado.  ?Claretha Cooper dosis de losartan a 50mg  diarios.  ?Comienze setraline 25mg  diarios por la ma?ana para ansiedad.  ? y legumbres en la dieta para mejorar colesterol.  ?Regresar en 1 a?o para proximo examen fisico  ? ?Mantenimiento de hombres ?Health Maintenance, Male ?Adoptar un estilo de vida saludable y recibir atenci?n preventiva son importantes para promover la salud y Jesse Phillips. Consulte al m?dico sobre: ?El esquema adecuado para [place] pruebas y ex?menes peri?dicos. ?Cosas que puede hacer por su cuenta para prevenir enfermedades y [place] sano. ??Qu? debo saber sobre la dieta, el peso y el ejercicio? ?Consuma una dieta saludable ? ?Consuma una dieta que incluya muchas verduras, frutas, productos l?cteos con bajo contenido de grasa y prote?nas magras. ?No consuma muchos alimentos ricos en grasas s?lidas, az?cares agregados o sodio. ?Mantenga un peso saludable ?El ?ndice de masa muscular [facility]) es una medida que puede utilizarse para identificar posibles problemas de [place]. Proporciona una estimaci?n de la grasa corporal bas?ndose en el peso y la altura. Su m?dico puede ayudarle a [facility] y a [place] o Engineer, site un peso saludable. ?Haga ejercicio con regularidad ?Haga ejercicio con regularidad. Esta es una de las pr?cticas m?s importantes que puede hacer por su salud. La mayor?a de los adultos deben seguir estas pautas: ?Realizar, al menos, 150 minutos de Personnel officer f?sica por semana. El ejercicio debe aumentar la frecuencia card?aca y Pharmacologist transpirar (ejercicio de intensidad moderada). ?Hacer ejercicios de fortalecimiento por lo [place] por semana. Agregue esto a su plan de ejercicio de intensidad moderada. ?Pase menos  tiempo sentado. Incluso la actividad f?sica ligera puede ser beneficiosa. ?Controle sus niveles de colesterol y l?pidos en la sangre ?Comience a realizarse an?lisis de l?pidos y Media planner en la sangre a los 20 a?os y luego rep?talos cada 5 a?os. ?Es posible que [facility] los niveles de colesterol con mayor frecuencia si: ?Sus niveles de l?pidos y colesterol son altos. ?Es mayor de 40 a?os. ?Presenta un alto riesgo de padecer enfermedades card?acas. ??Qu? debo saber sobre las pruebas de detecci?n del c?ncer? ?Muchos tipos de c?ncer pueden detectarse de manera temprana y, a menudo, pueden prevenirse. Seg?n su historia cl?nica y sus antecedentes familiares, es posible que deba realizarse pruebas de detecci?n del c?ncer en diferentes edades. Esto puede incluir pruebas de detecci?n de lo siguiente: ?C?ncer colorrectal. ?C?ncer de pr?stata. ?C?ncer de piel. ?C?ncer de pulm?n. ??Qu? debo saber sobre la enfermedad card?aca, la diabetes y la hipertensi?n arterial? ?Presi?n arterial y enfermedad card?aca ?La hipertensi?n arterial causa enfermedades card?acas y Oncologist el riesgo de accidente cerebrovascular. Es m?s probable que Insurance underwriter en las personas que tienen lecturas de presi?n arterial alta o tienen sobrepeso. ?Hable con el m?dico sobre sus valores de presi?n arterial deseados. ?H?gase controlar la presi?n arterial: ?Cada 3 a 5 a?os si tiene entre 18 y 77 a?os. ?Todos los a?os si es mayor de 40 a?os. ?Si tiene entre 65 y 74 a?os y es fumador o sol?a fumar, preg?ntele al m?dico si debe realizarse una prueba de detecci?n de aneurisma a?rtico abdominal (AAA) por ?nica vez. ?Diabetes ?Real?cese ex?menes de detecci?n de la diabetes con regularidad. Este an?lisis revisa el nivel de az?car en la sangre en [place]. H?gase las pruebas de detecci?n: ?Cada tres a?os  despu?s de los 45 a?os de edad si tiene un peso normal y un bajo riesgo de padecer diabetes. ?Con m?s frecuencia y a partir de [place] edad inferior si  tiene sobrepeso o un alto riesgo de padecer diabetes. ??Qu? debo saber sobre la prevenci?n de infecciones? ?Hepatitis B ?Si tiene un riesgo m?s alto de Primary school teacher hepatitis B, debe someterse a un examen de detecci?n de este virus. Hable con el m?dico para averiguar si tiene riesgo de contraer la infecci?n por hepatitis B. ?Hepatitis C ?Se recomienda un an?lisis de sangre para: ?[employer] 1945 y 1965. ?Todas las personas que tengan un riesgo de haber contra?do hepatitis C. ?Enfermedades de transmisi?n sexual (ETS) ?Debe realizarse pruebas de detecci?n de ITS todos los a?os, incluidas la gonorrea y la clamidia, si: ?Es sexualmente activo y es menor de 24 a?os. ?Es mayor de 24 a?os, y el m?dico le informa que corre riesgo de tener este tipo de infecciones. ?La actividad sexual ha cambiado desde que le hicieron la ?ltima prueba de detecci?n y tiene un riesgo mayor de tener clamidia o [place]. Preg?ntele al m?dico si usted tiene riesgo. ?Preg?ntele al m?dico si usted tiene un alto riesgo de Primary school teacher VIH. El m?dico tambi?n puede recomendarle un medicamento recetado para ayudar a evitar la infecci?n por el VIH. Si elige tomar medicamentos para prevenir el VIH, primero debe [employer] an?lisis de detecci?n del VIH. Luego debe hacerse an?lisis cada 3 meses mientras est? tomando los [employer]. ?Siga estas indicaciones en su casa: ?Consumo de alcohol ?No beba alcohol si el m?dico se lo proh?be. ?Si bebe alcohol: ?Limite la cantidad que consume de 0 a 2 bebidas por d?a. ?Sepa cu?nta cantidad de alcohol hay en las bebidas que toma. En los [address], una medida equivale a una botella de cerveza de 12 oz (355 ml), un vaso de vino de 5 oz (148 ml) o un vaso de una bebida alcoh?lica de alta graduaci?n de 1? oz (44 ml). ?Estilo de vida ?No consuma ning?n producto que contenga nicotina o tabaco. Estos productos incluyen cigarrillos, tabaco para mascar y aparatos de vapeo, como los cigarrillos electr?nicos.  Si necesita ayuda para dejar de consumir estos productos, consulte al m?dico. ?No consuma drogas. ?No comparta agujas. ?Solicite ayuda a su m?dico si necesita apoyo o informaci?n para abandonar las drogas. ?Indicaciones generales ?Real?cese los estudios de rutina de [address], dentales y de Wellsite geologist. ?Mant?ngase al d?a con las vacunas. ?Inf?rmele a su m?dico si: ?Se siente deprimido con frecuencia. ?Alguna vez ha sido v?ctima de Medical sales representative o no se siente seguro en su casa. ?Resumen ?Adoptar un estilo de vida saludable y recibir atenci?n preventiva son importantes para promover la salud y Counsellor. ?Siga las instrucciones del m?dico acerca de una dieta saludable, el ejercicio y la realizaci?n de pruebas o ex?menes para Hotel manager. ?Siga las instrucciones del m?dico con respecto al control del colesterol y la presi?n arterial. ?Esta informaci?n no tiene Theme park manager el consejo del m?dico. Aseg?rese de hacerle al m?dico cualquier pregunta que tenga. ?Document Revised: 01/16/2021 Document Reviewed: 01/16/2021 ?Elsevier Patient Education ? 2022 Elsevier Inc. ? ?

## 2021-11-25 NOTE — Assessment & Plan Note (Signed)
Chronic, deteriorated. Reviewed diet choices to improve LDL. Consider statin if persistently elevated.  ?The 10-year ASCVD risk score (Arnett DK, et al., 2019) is: 7.5%* ?  Values used to calculate the score: ?    Age: 56 years ?    Sex: Male ?    Is Non-Hispanic African American: No ?    Diabetic: No ?    Tobacco smoker: No ?    Systolic Blood Pressure: 140 mmHg ?    Is BP treated: Yes ?    HDL Cholesterol: 70 mg/dL* ?    Total Cholesterol: 236 mg/dL* ?    * - Cholesterol units were assumed for this score calculation  ?

## 2021-11-28 DIAGNOSIS — Z0131 Encounter for examination of blood pressure with abnormal findings: Secondary | ICD-10-CM | POA: Diagnosis not present

## 2021-12-09 ENCOUNTER — Ambulatory Visit: Payer: BC Managed Care – PPO | Admitting: Internal Medicine

## 2021-12-09 DIAGNOSIS — G4733 Obstructive sleep apnea (adult) (pediatric): Secondary | ICD-10-CM | POA: Diagnosis not present

## 2021-12-11 DIAGNOSIS — E785 Hyperlipidemia, unspecified: Secondary | ICD-10-CM | POA: Diagnosis not present

## 2021-12-11 DIAGNOSIS — Z043 Encounter for examination and observation following other accident: Secondary | ICD-10-CM | POA: Diagnosis not present

## 2021-12-11 DIAGNOSIS — Z713 Dietary counseling and surveillance: Secondary | ICD-10-CM | POA: Diagnosis not present

## 2021-12-23 ENCOUNTER — Ambulatory Visit: Payer: BC Managed Care – PPO | Admitting: Internal Medicine

## 2022-01-01 ENCOUNTER — Other Ambulatory Visit (INDEPENDENT_AMBULATORY_CARE_PROVIDER_SITE_OTHER): Payer: BC Managed Care – PPO

## 2022-01-01 DIAGNOSIS — Z1211 Encounter for screening for malignant neoplasm of colon: Secondary | ICD-10-CM | POA: Diagnosis not present

## 2022-01-01 DIAGNOSIS — I10 Essential (primary) hypertension: Secondary | ICD-10-CM | POA: Diagnosis not present

## 2022-01-02 LAB — FECAL OCCULT BLOOD, IMMUNOCHEMICAL: Fecal Occult Bld: NEGATIVE

## 2022-01-08 DIAGNOSIS — G4733 Obstructive sleep apnea (adult) (pediatric): Secondary | ICD-10-CM | POA: Diagnosis not present

## 2022-01-09 ENCOUNTER — Encounter: Payer: Self-pay | Admitting: Internal Medicine

## 2022-01-09 ENCOUNTER — Ambulatory Visit (INDEPENDENT_AMBULATORY_CARE_PROVIDER_SITE_OTHER): Payer: BC Managed Care – PPO | Admitting: Internal Medicine

## 2022-01-09 VITALS — BP 150/90 | HR 94 | Temp 97.8°F | Resp 16 | Ht 71.0 in | Wt 208.0 lb

## 2022-01-09 DIAGNOSIS — Z72821 Inadequate sleep hygiene: Secondary | ICD-10-CM | POA: Diagnosis not present

## 2022-01-09 DIAGNOSIS — Z7189 Other specified counseling: Secondary | ICD-10-CM

## 2022-01-09 DIAGNOSIS — G4733 Obstructive sleep apnea (adult) (pediatric): Secondary | ICD-10-CM | POA: Diagnosis not present
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
## 2022-01-09 NOTE — Patient Instructions (Signed)

## 2022-01-15 DIAGNOSIS — I10 Essential (primary) hypertension: Secondary | ICD-10-CM | POA: Diagnosis not present

## 2022-01-29 DIAGNOSIS — R21 Rash and other nonspecific skin eruption: Secondary | ICD-10-CM | POA: Diagnosis not present

## 2022-01-29 DIAGNOSIS — I10 Essential (primary) hypertension: Secondary | ICD-10-CM | POA: Diagnosis not present

## 2022-02-05 DIAGNOSIS — I10 Essential (primary) hypertension: Secondary | ICD-10-CM | POA: Diagnosis not present

## 2022-02-06 DIAGNOSIS — R030 Elevated blood-pressure reading, without diagnosis of hypertension: Secondary | ICD-10-CM | POA: Diagnosis not present

## 2022-02-07 DIAGNOSIS — G4733 Obstructive sleep apnea (adult) (pediatric): Secondary | ICD-10-CM | POA: Diagnosis not present

## 2022-02-19 DIAGNOSIS — I10 Essential (primary) hypertension: Secondary | ICD-10-CM | POA: Diagnosis not present

## 2022-03-11 DIAGNOSIS — G4733 Obstructive sleep apnea (adult) (pediatric): Secondary | ICD-10-CM | POA: Diagnosis not present

## 2022-03-13 DIAGNOSIS — R030 Elevated blood-pressure reading, without diagnosis of hypertension: Secondary | ICD-10-CM | POA: Diagnosis not present

## 2022-03-19 DIAGNOSIS — I10 Essential (primary) hypertension: Secondary | ICD-10-CM | POA: Diagnosis not present

## 2022-04-02 DIAGNOSIS — I10 Essential (primary) hypertension: Secondary | ICD-10-CM | POA: Diagnosis not present

## 2022-04-10 DIAGNOSIS — G4733 Obstructive sleep apnea (adult) (pediatric): Secondary | ICD-10-CM | POA: Diagnosis not present

## 2022-04-16 DIAGNOSIS — I10 Essential (primary) hypertension: Secondary | ICD-10-CM | POA: Diagnosis not present

## 2022-04-17 DIAGNOSIS — R030 Elevated blood-pressure reading, without diagnosis of hypertension: Secondary | ICD-10-CM | POA: Diagnosis not present

## 2022-04-23 DIAGNOSIS — I10 Essential (primary) hypertension: Secondary | ICD-10-CM | POA: Diagnosis not present

## 2022-04-30 DIAGNOSIS — I10 Essential (primary) hypertension: Secondary | ICD-10-CM | POA: Diagnosis not present

## 2022-05-07 DIAGNOSIS — Z0189 Encounter for other specified special examinations: Secondary | ICD-10-CM | POA: Diagnosis not present

## 2022-05-14 DIAGNOSIS — I10 Essential (primary) hypertension: Secondary | ICD-10-CM | POA: Diagnosis not present

## 2022-05-14 DIAGNOSIS — G4733 Obstructive sleep apnea (adult) (pediatric): Secondary | ICD-10-CM | POA: Diagnosis not present

## 2022-05-28 ENCOUNTER — Ambulatory Visit: Payer: BC Managed Care – PPO

## 2022-06-11 ENCOUNTER — Ambulatory Visit (INDEPENDENT_AMBULATORY_CARE_PROVIDER_SITE_OTHER): Payer: BC Managed Care – PPO

## 2022-06-11 DIAGNOSIS — Z23 Encounter for immunization: Secondary | ICD-10-CM | POA: Diagnosis not present

## 2022-06-13 DIAGNOSIS — G4733 Obstructive sleep apnea (adult) (pediatric): Secondary | ICD-10-CM | POA: Diagnosis not present

## 2022-06-25 DIAGNOSIS — I10 Essential (primary) hypertension: Secondary | ICD-10-CM | POA: Diagnosis not present

## 2022-07-02 DIAGNOSIS — I10 Essential (primary) hypertension: Secondary | ICD-10-CM | POA: Diagnosis not present

## 2022-07-10 DIAGNOSIS — R21 Rash and other nonspecific skin eruption: Secondary | ICD-10-CM | POA: Diagnosis not present

## 2022-07-10 DIAGNOSIS — R030 Elevated blood-pressure reading, without diagnosis of hypertension: Secondary | ICD-10-CM | POA: Diagnosis not present

## 2022-07-14 DIAGNOSIS — G4733 Obstructive sleep apnea (adult) (pediatric): Secondary | ICD-10-CM | POA: Diagnosis not present

## 2022-07-23 DIAGNOSIS — I10 Essential (primary) hypertension: Secondary | ICD-10-CM | POA: Diagnosis not present

## 2022-08-13 DIAGNOSIS — G4733 Obstructive sleep apnea (adult) (pediatric): Secondary | ICD-10-CM | POA: Diagnosis not present

## 2022-09-12 DIAGNOSIS — G4733 Obstructive sleep apnea (adult) (pediatric): Secondary | ICD-10-CM | POA: Diagnosis not present

## 2022-10-13 DIAGNOSIS — G4733 Obstructive sleep apnea (adult) (pediatric): Secondary | ICD-10-CM | POA: Diagnosis not present

## 2022-11-12 DIAGNOSIS — G4733 Obstructive sleep apnea (adult) (pediatric): Secondary | ICD-10-CM | POA: Diagnosis not present

## 2022-11-19 ENCOUNTER — Other Ambulatory Visit: Payer: Self-pay | Admitting: Family Medicine

## 2022-11-19 DIAGNOSIS — I10 Essential (primary) hypertension: Secondary | ICD-10-CM

## 2022-12-12 DIAGNOSIS — G4733 Obstructive sleep apnea (adult) (pediatric): Secondary | ICD-10-CM | POA: Diagnosis not present

## 2022-12-25 LAB — LAB REPORT - SCANNED
A1c: 5.9
EGFR: 86

## 2022-12-29 ENCOUNTER — Ambulatory Visit (INDEPENDENT_AMBULATORY_CARE_PROVIDER_SITE_OTHER): Payer: BC Managed Care – PPO | Admitting: Family Medicine

## 2022-12-29 ENCOUNTER — Encounter: Payer: Self-pay | Admitting: Family Medicine

## 2022-12-29 VITALS — BP 126/78 | HR 85 | Temp 97.4°F | Ht 70.5 in | Wt 216.4 lb

## 2022-12-29 DIAGNOSIS — I10 Essential (primary) hypertension: Secondary | ICD-10-CM | POA: Diagnosis not present

## 2022-12-29 DIAGNOSIS — Z1211 Encounter for screening for malignant neoplasm of colon: Secondary | ICD-10-CM

## 2022-12-29 DIAGNOSIS — K429 Umbilical hernia without obstruction or gangrene: Secondary | ICD-10-CM

## 2022-12-29 DIAGNOSIS — E559 Vitamin D deficiency, unspecified: Secondary | ICD-10-CM

## 2022-12-29 DIAGNOSIS — Z0000 Encounter for general adult medical examination without abnormal findings: Secondary | ICD-10-CM | POA: Diagnosis not present

## 2022-12-29 DIAGNOSIS — E785 Hyperlipidemia, unspecified: Secondary | ICD-10-CM | POA: Diagnosis not present

## 2022-12-29 DIAGNOSIS — R7303 Prediabetes: Secondary | ICD-10-CM

## 2022-12-29 DIAGNOSIS — Z6830 Body mass index (BMI) 30.0-30.9, adult: Secondary | ICD-10-CM

## 2022-12-29 DIAGNOSIS — G4733 Obstructive sleep apnea (adult) (pediatric): Secondary | ICD-10-CM | POA: Diagnosis not present

## 2022-12-29 DIAGNOSIS — F419 Anxiety disorder, unspecified: Secondary | ICD-10-CM

## 2022-12-29 MED ORDER — ATORVASTATIN CALCIUM 20 MG PO TABS: 20.0000 mg | ORAL_TABLET | Freq: Every day | ORAL | 4 refills | Status: DC

## 2022-12-29 MED ORDER — LOSARTAN POTASSIUM 50 MG PO TABS: 50.0000 mg | ORAL_TABLET | Freq: Every day | ORAL | 4 refills | Status: DC

## 2022-12-29 NOTE — Assessment & Plan Note (Signed)
Again deteriorated with weight gain, encouraged limiting added sugar in diet.

## 2022-12-29 NOTE — Assessment & Plan Note (Addendum)
Continue vit D 2000 IU daily.  

## 2022-12-29 NOTE — Assessment & Plan Note (Addendum)
Chronic, uncontrolled. Agrees to start statin - start atorvastatin 20mg  daily. He will check at work if Lp(a) covered.  The 10-year ASCVD risk score (Arnett DK, et al., 2019) is: 8.7%*   Values used to calculate the score:     Age: 57 years     Sex: Male     Is Non-Hispanic African American: No     Diabetic: No     Tobacco smoker: No     Systolic Blood Pressure: 126 mmHg     Is BP treated: Yes     HDL Cholesterol: 57 mg/dL*     Total Cholesterol: 251 mg/dL*     * - Cholesterol units were assumed for this score calculation

## 2022-12-29 NOTE — Patient Instructions (Addendum)
Revise en el trabajo si pueden ordenar examen de sangre: Lipoprotein (a).  Gusto verlo hoy.  Comienze lipitor 20mg  diarios para colesterol, dejeme saber si alguna dificultad con medicamento.  Lo remitiremos a cirugano para evaluar hernia umbilical.  Regresar en 1 ao para proximo examen fisico.

## 2022-12-29 NOTE — Assessment & Plan Note (Addendum)
Ongoing but not bothersome. Given size, refer to gen surg for eval.

## 2022-12-29 NOTE — Progress Notes (Signed)
Ph: [phone]       Fax: [phone]   Patient ID: Jesse Phillips, male    DOB: 05/11/1966, 57 y.o.   MRN: [id number]  This visit was conducted in person.  BP 126/78   Pulse 85   Temp (!) 97.4 F (36.3 C) (Temporal)   Ht 5' 10.5" (1.791 m)   Wt 216 lb 6 oz (98.1 kg)   SpO2 96%   BMI 30.61 kg/m   BP Readings from Last 3 Encounters:  12/29/22 126/78  01/09/22 (!) 150/90  11/25/21 140/78    CC: CPE Subjective:   HPI: Jesse Phillips is a 57 y.o. male presenting on 12/29/2022 for Annual Exam (Pt provided copy of recent labs and BP log [to keep].)   Gets labs at work Q6 mo.  OSA on CPAP followed by Dr Welton Flakes. He continues using CPAP with good compliance.   Brings work BP log showing good control - 120-140s/80s. He continues losartan.   High stress at work. ongoing mild social anxiety, agoraphobia ie at church he needs to position himself at the back of the building but no longer leaves church. Initially started low dose sertraline 25mg  but stopped as he didn't note significant benefit. He's treated with green juicing and garlic.   Preventative:  Colon cancer screening - iFOB negative, continue yearly.  Prostate cancer screening - brother with prostate cancer age 63. Completes screening yearly at work.  Lung cancer screening - not eligible  Flu shot yearly  COVID vaccine - Moderna 12/2019, 01/2020, booster 09/2020 Tdap 10/2014  Completed Hep B series Shingrix - 11/2021, 05/2022  Seat belt use discussed  Sunscreen use discussed. No changing mole on skin.  Non smoker  Alcohol - rare beer  Dentist yearly  Eye exam yearly    Lives with Riki Altes wife and 1 son  Occupation: Location manager 5am to 5pm  From [place]  Edu: HS  Activity: occasionally gym, walking regularly  Diet: wife cooks healthy, good water      Relevant past medical, surgical, family and social history reviewed and updated as indicated. Interim medical history since our last visit reviewed. Allergies and  medications reviewed and updated. Outpatient Medications Prior to Visit  Medication Sig Dispense Refill   Ascorbic Acid (VITAMIN C) 500 MG CAPS Take 1 capsule by mouth daily.     Cholecalciferol (VITAMIN D) 2000 UNITS CAPS Take 1 capsule by mouth daily.     Multiple Vitamins-Minerals (CENTRUM ADULTS PO) Take 1 tablet by mouth daily.     OXYGEN Inhale into the lungs. Uses concentrator on Cpap at night     triamcinolone cream (KENALOG) 0.1 % Apply 1 application. topically 2 (two) times daily. 80 g 0   losartan (COZAAR) 50 MG tablet Take 1 tablet by mouth once daily 90 tablet 0   sertraline (ZOLOFT) 25 MG tablet Take 1 tablet (25 mg total) by mouth daily. 30 tablet 11   No facility-administered medications prior to visit.     Per HPI unless specifically indicated in ROS section below Review of Systems  Constitutional:  Negative for activity change, appetite change, chills, fatigue, fever and unexpected weight change.  HENT:  Negative for hearing loss.   Eyes:  Negative for visual disturbance.  Respiratory:  Negative for cough, chest tightness, shortness of breath and wheezing.   Cardiovascular:  Positive for palpitations (related to anxiety). Negative for chest pain and leg swelling.  Gastrointestinal:  Negative for abdominal distention, abdominal pain, blood in stool, constipation, diarrhea,  nausea and vomiting.  Genitourinary:  Negative for difficulty urinating and hematuria.  Musculoskeletal:  Negative for arthralgias, myalgias and neck pain.  Skin:  Negative for rash.  Neurological:  Negative for dizziness, seizures, syncope and headaches.  Hematological:  Negative for adenopathy. Does not bruise/bleed easily.  Psychiatric/Behavioral:  Negative for dysphoric mood. The patient is not nervous/anxious.     Objective:  BP 126/78   Pulse 85   Temp (!) 97.4 F (36.3 C) (Temporal)   Ht 5' 10.5" (1.791 m)   Wt 216 lb 6 oz (98.1 kg)   SpO2 96%   BMI 30.61 kg/m   Wt Readings from Last  3 Encounters:  12/29/22 216 lb 6 oz (98.1 kg)  01/09/22 208 lb (94.3 kg)  11/25/21 208 lb (94.3 kg)      Physical Exam Vitals and nursing note reviewed.  Constitutional:      General: He is not in acute distress.    Appearance: Normal appearance. He is well-developed. He is not ill-appearing.  HENT:     Head: Normocephalic and atraumatic.     Right Ear: Hearing, tympanic membrane, ear canal and external ear normal.     Left Ear: Hearing, tympanic membrane, ear canal and external ear normal.     Nose: Nose normal.     Mouth/Throat:     Mouth: Mucous membranes are moist.     Pharynx: Oropharynx is clear. No oropharyngeal exudate or posterior oropharyngeal erythema.  Eyes:     General: No scleral icterus.    Extraocular Movements: Extraocular movements intact.     Conjunctiva/sclera: Conjunctivae normal.     Pupils: Pupils are equal, round, and reactive to light.  Neck:     Thyroid: No thyroid mass or thyromegaly.  Cardiovascular:     Rate and Rhythm: Normal rate and regular rhythm.     Pulses: Normal pulses.          Radial pulses are 2+ on the right side and 2+ on the left side.     Heart sounds: Normal heart sounds. No murmur heard. Pulmonary:     Effort: Pulmonary effort is normal. No respiratory distress.     Breath sounds: Normal breath sounds. No wheezing, rhonchi or rales.  Abdominal:     General: Bowel sounds are normal. There is no distension.     Palpations: Abdomen is soft. There is no mass.     Tenderness: There is no abdominal tenderness. There is no guarding or rebound.     Hernia: A hernia is present. Hernia is present in the umbilical area.  Musculoskeletal:        General: Normal range of motion.     Cervical back: Normal range of motion and neck supple.     Right lower leg: No edema.     Left lower leg: No edema.  Lymphadenopathy:     Cervical: No cervical adenopathy.  Skin:    General: Skin is warm and dry.     Findings: No rash.  Neurological:      General: No focal deficit present.     Mental Status: He is alert and oriented to person, place, and time.  Psychiatric:        Mood and Affect: Mood normal.        Behavior: Behavior normal.        Thought Content: Thought content normal.        Judgment: Judgment normal.       Results for orders placed or  performed in visit on 12/25/22  Lab report - scanned  Result Value Ref Range   EGFR 86.0    A1c 5.9%    See scanned section for recent work labs.  TC 266, LDL 182 A1c 5.9%  Assessment & Plan:   Problem List Items Addressed This Visit     HLD (hyperlipidemia)    Chronic, uncontrolled. Agrees to start statin - start atorvastatin 20mg  daily. He will check at work if Lp(a) covered.  The 10-year ASCVD risk score (Arnett DK, et al., 2019) is: 8.7%*   Values used to calculate the score:     Age: 53 years     Sex: Male     Is Non-Hispanic African American: No     Diabetic: No     Tobacco smoker: No     Systolic Blood Pressure: 126 mmHg     Is BP treated: Yes     HDL Cholesterol: 57 mg/dL*     Total Cholesterol: 251 mg/dL*     * - Cholesterol units were assumed for this score calculation       Relevant Medications   losartan (COZAAR) 50 MG tablet   atorvastatin (LIPITOR) 20 MG tablet   OSA on CPAP    Chronic, stable period on CPAP with night time O2, followed by Dr Welton [facility]akes at [facility]      BMI 30.0-30.9,adult    Discussed weight gain noted. Encouraged healthy diet and lifestyle choices to affect sustainable weight loss.       Prediabetes    Again deteriorated with weight gain, encouraged limiting added sugar in diet.       Vitamin D deficiency    Continue vit D 2000 IU daily.       Health maintenance examination - Primary (Chronic)    Preventative protocols reviewed and updated unless pt declined. Discussed healthy diet and lifestyle.       White coat syndrome with hypertension    Chronic, stable. Continue current regimen.       Relevant Medications    losartan (COZAAR) 50 MG tablet   atorvastatin (LIPITOR) 20 MG tablet   Anxiety    Notes improvement over the past year, he stopped sertraline 25mg  as felt it was ineffective.       Umbilical hernia    Ongoing but not bothersome. Given size, refer to gen surg for eval.       Relevant Orders   Ambulatory referral to General Surgery   Other Visit Diagnoses     Special screening for malignant neoplasms, colon       Relevant Orders   Fecal occult blood, imunochemical        Meds ordered this encounter  Medications   losartan (COZAAR) 50 MG tablet    Sig: Take 1 tablet (50 mg total) by mouth daily.    Dispense:  90 tablet    Refill:  4   atorvastatin (LIPITOR) 20 MG tablet    Sig: Take 1 tablet (20 mg total) by mouth daily.    Dispense:  90 tablet    Refill:  4    Orders Placed This Encounter  Procedures   Fecal occult blood, imunochemical    Standing Status:   Future    Standing Expiration Date:   12/29/2023   Ambulatory referral to General Surgery    Referral Priority:   Routine    Referral Type:   Surgical    Referral Reason:   Specialty Services Required    Requested  Specialty:   General Surgery    Number of Visits Requested:   1    Patient Instructions  Revise en el trabajo si pueden ordenar examen de sangre: Lipoprotein (a).  Gusto verlo hoy.  Comienze lipitor 20mg  diarios para colesterol, dejeme saber si alguna dificultad con medicamento.  Lo remitiremos a cirugano para evaluar hernia umbilical.  Regresar en 1 ao para proximo examen fisico.   Follow up plan: Return in about 1 year (around 12/29/2023) for annual exam, prior fasting for blood work.  Eustaquio Boyden, MD

## 2022-12-29 NOTE — Assessment & Plan Note (Signed)
Preventative protocols reviewed and updated unless pt declined. Discussed healthy diet and lifestyle.  

## 2022-12-29 NOTE — Assessment & Plan Note (Signed)
Chronic, stable period on CPAP with night time O2, followed by Dr Welton Flakes at [facility]

## 2022-12-29 NOTE — Assessment & Plan Note (Signed)
Notes improvement over the past year, he stopped sertraline 25mg  as felt it was ineffective.

## 2022-12-29 NOTE — Assessment & Plan Note (Signed)
Discussed weight gain noted.  Encouraged healthy diet and lifestyle choices to affect sustainable weight loss.  

## 2022-12-29 NOTE — Assessment & Plan Note (Signed)
Chronic, stable. Continue current regimen. 

## 2023-01-02 ENCOUNTER — Other Ambulatory Visit (INDEPENDENT_AMBULATORY_CARE_PROVIDER_SITE_OTHER): Payer: BC Managed Care – PPO | Admitting: Radiology

## 2023-01-02 DIAGNOSIS — Z1211 Encounter for screening for malignant neoplasm of colon: Secondary | ICD-10-CM | POA: Diagnosis not present

## 2023-01-02 LAB — FECAL OCCULT BLOOD, IMMUNOCHEMICAL: Fecal Occult Bld: NEGATIVE

## 2023-01-05 ENCOUNTER — Encounter: Payer: Self-pay | Admitting: Nurse Practitioner

## 2023-01-05 ENCOUNTER — Ambulatory Visit (INDEPENDENT_AMBULATORY_CARE_PROVIDER_SITE_OTHER): Payer: BC Managed Care – PPO | Admitting: Nurse Practitioner

## 2023-01-05 VITALS — BP 136/78 | HR 79 | Temp 98.3°F | Resp 16 | Ht 70.5 in | Wt 217.8 lb

## 2023-01-05 DIAGNOSIS — G4733 Obstructive sleep apnea (adult) (pediatric): Secondary | ICD-10-CM | POA: Diagnosis not present

## 2023-01-05 DIAGNOSIS — Z72821 Inadequate sleep hygiene: Secondary | ICD-10-CM | POA: Diagnosis not present

## 2023-01-05 DIAGNOSIS — Z7189 Other specified counseling: Secondary | ICD-10-CM | POA: Diagnosis not present

## 2023-01-05 NOTE — Progress Notes (Signed)
[facility] [address] [place], [place] [address]  Internal MEDICINE  Office Visit Note  Patient Name: Jesse Phillips  [id number]  [id number]  Date of Service: 01/05/2023  Chief Complaint  Patient presents with   Follow-up    HPI Jesse Phillips presents for a follow-up visit for OSA on CPAP OSA on CPAP. He states that he is doing well with the CPAP. Sleeps with it for at least 7-10 hours at night. Denies issues with sinus. Reports cough has gotten worse. No headaches. Wakes up well-rested, denies any excessive daytime sleepiness.  Uses CPAP every night CPAP download not available.  Try to sing but high notes cause headache and pressure to go up.  Does not need any supplies, gets them regularly delivered    EPWORTH SLEEPINESS SCALE: Scale: (0)= no chance of dozing; (1)= slight chance of dozing; (2)= moderate chance of dozing; (3)= high chance of dozing Chance  Situation   Sitting and reading: 0 Watching TV: 1 Sitting Inactive in public: 1 As a passenger in car: 0   Lying down to rest: 0 Sitting and talking: 0 Sitting quielty after lunch: 0 In a car, stopped in traffic: 0 TOTAL SCORE:   2 out of 24    Current Medication: Outpatient Encounter Medications as of 01/05/2023  Medication Sig   Ascorbic Acid (VITAMIN C) 500 MG CAPS Take 1 capsule by mouth daily.   atorvastatin (LIPITOR) 20 MG tablet Take 1 tablet (20 mg total) by mouth daily.   Cholecalciferol (VITAMIN D) 2000 UNITS CAPS Take 1 capsule by mouth daily.   losartan (COZAAR) 50 MG tablet Take 1 tablet (50 mg total) by mouth daily.   Multiple Vitamins-Minerals (CENTRUM ADULTS PO) Take 1 tablet by mouth daily.   OXYGEN Inhale into the lungs. Uses concentrator on Cpap at night   triamcinolone cream (KENALOG) 0.1 % Apply 1 application. topically 2 (two) times daily.   No facility-administered encounter medications on file as of 01/05/2023.    Surgical History: Past Surgical History:  Procedure Laterality  Date   NO PAST SURGERIES      Medical History: Past Medical History:  Diagnosis Date   HLD (hyperlipidemia)    HTN (hypertension)    OSA on CPAP    compliant   Prediabetes    Vitamin D deficiency     Family History: Family History  Problem Relation Age of Onset   Hypertension Father    Hyperlipidemia Father    Cancer Brother 64       prostate   Depression Brother        deceased (homosexual)   Deep vein thrombosis Brother    Diabetes Neg Hx    CAD Neg Hx    Stroke Neg Hx     Social History   Socioeconomic History   Marital status: Married    Spouse name: Not on file   Number of children: Not on file   Years of education: Not on file   Highest education level: Not on file  Occupational History   Not on file  Tobacco Use   Smoking status: Never   Smokeless tobacco: Never  Vaping Use   Vaping Use: Never used  Substance and Sexual Activity   Alcohol use: Yes    Alcohol/week: 1.0 standard drink of alcohol    Types: 1 Cans of beer per week    Comment: 1 beer every day and 8 beers a day on weekend   Drug use: No   Sexual activity:  Not on file  Other Topics Concern   Not on file  Social History Narrative   Lives with Jesse Phillips wife and 1 son   Occupation: Location manager   From [place]   Edu: HS   Activity: occasionally gym   Diet: likes but avoids sweets and carbs, wife cooks healthy    O+ blood type   Social Determinants of Corporate investment banker Strain: Not on file  Food Insecurity: Not on file  Transportation Needs: Not on file  Physical Activity: Not on file  Stress: Not on file  Social Connections: Not on file  Intimate Partner Violence: Not on file      Review of Systems  Constitutional:  Negative for chills, fatigue and unexpected weight change.  HENT:  Negative for congestion, postnasal drip, rhinorrhea, sneezing and sore throat.   Respiratory: Negative.  Negative for cough, chest tightness, shortness of breath and wheezing.    Cardiovascular: Negative.  Negative for chest pain and palpitations.  Gastrointestinal:  Negative for abdominal pain, constipation, diarrhea, nausea and vomiting.  Musculoskeletal:  Negative for arthralgias, back pain, joint swelling and neck pain.  Skin:  Negative for rash.  Neurological: Negative.  Negative for tremors.  Psychiatric/Behavioral:  Negative for behavioral problems (Depression), sleep disturbance and suicidal ideas. The patient is not nervous/anxious.     Vital Signs: BP 136/78 Comment: 138/70  Pulse 79   Temp 98.3 F (36.8 C)   Resp 16   Ht 5' 10.5" (1.791 m)   Wt 217 lb 12.8 oz (98.8 kg)   SpO2 99%   BMI 30.81 kg/m    Physical Exam Vitals reviewed.  Constitutional:      General: He is not in acute distress.    Appearance: Normal appearance. He is not ill-appearing.  HENT:     Head: Normocephalic and atraumatic.  Eyes:     Pupils: Pupils are equal, round, and reactive to light.  Cardiovascular:     Rate and Rhythm: Normal rate and regular rhythm.  Pulmonary:     Effort: Pulmonary effort is normal. No respiratory distress.  Neurological:     Mental Status: He is alert.        Assessment/Plan: 1. OSA on CPAP Continue to use CPAP machine as instructed.   2. CPAP use counseling No issues or concerns with the use and   3. Inadequate sleep hygiene Improving per patient, no issues.  4. Obesity, morbid (HCC) Weight loss will improve his sleep apnea.   General Counseling: Jesse Phillips verbalizes understanding of the findings of todays visit and agrees with plan of treatment. I have discussed any further diagnostic evaluation that may be needed or ordered today. We also reviewed his medications today. he has been encouraged to call the office with any questions or concerns that should arise related to todays visit.    No orders of the defined types were placed in this encounter.   No orders of the defined types were placed in this  encounter.   Return in about 1 year (around 01/05/2024) for annual F/U, pulmonary/sleep with Jesse Phillips or DSK.   Total time spent:30 Minutes Time spent includes review of chart, medications, test results, and follow up plan with the patient.   [place] Controlled Substance Database was reviewed by me.  This patient was seen by Sallyanne Kuster, FNP-C in collaboration with Dr. Beverely Risen as a part of collaborative care agreement.   Saddie Sandeen R. Tedd Sias, MSN, FNP-C Internal medicine

## 2023-01-12 DIAGNOSIS — G4733 Obstructive sleep apnea (adult) (pediatric): Secondary | ICD-10-CM | POA: Diagnosis not present

## 2023-01-14 ENCOUNTER — Ambulatory Visit: Payer: Self-pay | Admitting: Surgery

## 2023-01-21 ENCOUNTER — Encounter: Payer: Self-pay | Admitting: Surgery

## 2023-01-21 ENCOUNTER — Ambulatory Visit (INDEPENDENT_AMBULATORY_CARE_PROVIDER_SITE_OTHER): Payer: BC Managed Care – PPO | Admitting: Surgery

## 2023-01-21 VITALS — BP 153/83 | HR 87 | Temp 99.1°F | Ht 69.0 in | Wt 214.8 lb

## 2023-01-21 DIAGNOSIS — K429 Umbilical hernia without obstruction or gangrene: Secondary | ICD-10-CM | POA: Diagnosis not present

## 2023-01-21 NOTE — Patient Instructions (Addendum)
Si tiene alguna inquietud o pregunta, no dude en llamar a nuestra oficina.   Hernia umbilical en los adultos Umbilical Hernia, Adult  Una hernia es un bulto de tejido que sobresale a travs de una abertura [employer]. Una hernia umbilical se produce en el abdomen, cerca del ombligo. La hernia puede contener tejidos del intestino delgado, el intestino grueso o tejido graso que recubre los intestinos. Las hernias USAA en los adultos suelen empeorar con el tiempo y requieren tratamiento quirrgico. Existen diferentes tipos de hernias umbilicales, entre ellas: Hernia indirecta. Este tipo se encuentra justo encima o debajo del ombligo. Es el tipo de hernia umbilical ms frecuente en los adultos. Hernia directa. Este tipo se forma a travs de una abertura formada por el ombligo. Hernia reducible. Este tipo de hernia viene y va. Puede ser visible solo al hacer fuerza, levantar objetos pesados o toser. Este tipo de hernia se puede reintroducir en el abdomen (reducir). Hernia encarcelada. Este tipo atrapa el tejido abdominal dentro de la hernia. Este tipo de hernia es irreducible. Hernia estrangulada. Se trata de una hernia que interrumpe el flujo de sangre a los tejidos en su interior. Si esto ocurre, los tejidos Mining engineer a Furniture conservator/restorer. Este tipo de hernia requiere tratamiento de Associate Professor. Cules son las causas? Una hernia umbilical se produce cuando el tejido dentro del abdomen ejerce presin sobre una zona debilitada de los msculos abdominales. Qu incrementa el riesgo? Puede correr un mayor riesgo de Warehouse manager esta afeccin en los siguientes casos: Tiene obesidad. Tuvo varios embarazos. Tiene una acumulacin de lquido dentro del abdomen. Se someti a una ciruga que [employer] abdominales. Cules son los signos o sntomas? El principal sntoma de esta afeccin es un bulto en el ombligo o cerca de este que no causa dolor. Una hernia reducible puede ser visible solo al  hacer fuerza, levantar objetos pesados o toser. Otros sntomas pueden incluir lo siguiente: Dolor sordo. Sensacin de opresin. Los sntomas de una hernia estrangulada pueden incluir los siguientes: Dolor que se vuelve cada vez ms intenso. Nuseas y vmitos. Dolor al ejercer presin sobre la hernia. Cambio de color de la piel que recubre la hernia que se torna roja o violcea. Estreimiento. Sangre en las heces. Cmo se diagnostica? Esta afeccin se puede diagnosticar en funcin de lo siguiente: Un examen fsico. Pueden pedirle que tosa o que haga fuerza mientras est de pie. Estas acciones aumentan la presin dentro del abdomen y pueden empujar la hernia a travs de la abertura en los msculos. El mdico puede ejercer presin sobre la hernia para tratar de reducirla. Los sntomas y los antecedentes mdicos. Cmo se trata? La ciruga es el nico tratamiento para una hernia umbilical. En el caso de que la hernia est estrangulada, esta se realiza lo antes posible. Si tiene una hernia pequea que no est encarcelada, tal vez tenga que bajar de peso antes de la [place]. Siga estas instrucciones en su casa: Baje de peso, si se lo indic el mdico. No trate de reintroducir la hernia. Observe si la hernia cambia de color o de tamao. Infrmele al mdico si se producen cambios. Tal vez deba evitar las actividades que aumentan la presin sobre la hernia. No levante ningn objeto que pese ms de 10 libras (4.5 kg) o que supere el lmite de peso que le hayan indicado, Teacher, adult education que el mdico le diga que puede [place]. Use los medicamentos de venta libre y los recetados solamente como se lo haya indicado el mdico. Cumpla  con todas las visitas de seguimiento. Esto es importante. Comunquese con un mdico si: La hernia se agranda. La hernia le causa dolor. Solicite ayuda de inmediato si: Tiene un dolor intenso y repentino cerca de la zona de la hernia. Tiene dolor, as como nuseas o vmitos. Tiene  dolor y la piel que recubre la hernia cambia de color. Tiene escalofros o fiebre. Resumen Una hernia es un bulto de tejido que sobresale a travs de una abertura [employer]. Una hernia umbilical se produce cerca del ombligo. La ciruga es el nico tratamiento para una hernia umbilical. No trate de reintroducir la hernia. Cumpla con todas las visitas de seguimiento. Esto es importante. Esta informacin no tiene Theme park manager el consejo del mdico. Asegrese de hacerle al mdico cualquier pregunta que tenga. Document Revised: 01/04/2021 Document Reviewed: 01/04/2021 Elsevier Patient Education  2024 [employer].

## 2023-01-21 NOTE — Progress Notes (Signed)
01/21/2023  Reason for Visit:  Umbilical hernia  Requesting Provider:  Eustaquio Boyden, MD  History of Present Illness: Alpha Geers is a 57 y.o. male presenting for evaluation of an umbilical hernia.  The patient reports that he's noticed this for about a year.  He denies any significant symptoms from it.  He does some heavy lifting at work and has noticed only minimal discomfort when doing something more particularly strenuous.  During the day the hernia bulges and he can push it back it but it bulges again.  Denies any nausea, vomiting, fevers, chills, chest pain, shortness of breath.  Has not noticed any other areas of bulging.  Past Medical History: Past Medical History:  Diagnosis Date   HLD (hyperlipidemia)    HTN (hypertension)    OSA on CPAP    compliant   Prediabetes    Vitamin D deficiency      Past Surgical History: Past Surgical History:  Procedure Laterality Date   NO PAST SURGERIES      Home Medications: Prior to Admission medications   Medication Sig Start Date End Date Taking? Authorizing Provider  Ascorbic Acid (VITAMIN C) 500 MG CAPS Take 1 capsule by mouth daily. 08/12/19  Yes Eustaquio Boyden, MD  atorvastatin (LIPITOR) 20 MG tablet Take 1 tablet (20 mg total) by mouth daily. 12/29/22  Yes Eustaquio Boyden, MD  Cholecalciferol (VITAMIN D) 2000 UNITS CAPS Take 1 capsule by mouth daily.   Yes [provider]  losartan (COZAAR) 50 MG tablet Take 1 tablet (50 mg total) by mouth daily. 12/29/22  Yes Eustaquio Boyden, MD  Multiple Vitamins-Minerals (CENTRUM ADULTS PO) Take 1 tablet by mouth daily.   Yes [provider]  OXYGEN Inhale into the lungs. Uses concentrator on Cpap at night   Yes [provider]  triamcinolone cream (KENALOG) 0.1 % Apply 1 application. topically 2 (two) times daily. 11/25/21  Yes Eustaquio Boyden, MD    Allergies: No Known Allergies  Social History:  reports that he has never smoked. He has never used  smokeless tobacco. He reports current alcohol use of about 1.0 standard drink of alcohol per week. He reports that he does not use drugs.   Family History: Family History  Problem Relation Age of Onset   Hypertension Father    Hyperlipidemia Father    Cancer Brother 55       prostate   Depression Brother        deceased (homosexual)   Deep vein thrombosis Brother    Diabetes Neg Hx    CAD Neg Hx    Stroke Neg Hx     Review of Systems: Review of Systems  Constitutional:  Negative for chills and fever.  Respiratory:  Negative for shortness of breath.   Cardiovascular:  Negative for chest pain.  Gastrointestinal:  Positive for abdominal pain (minimal soreness with heavy exertion). Negative for constipation, nausea and vomiting.  Genitourinary:  Negative for dysuria.  Musculoskeletal:  Negative for myalgias.    Physical Exam BP (!) 153/83   Pulse 87   Temp 99.1 F (37.3 C) (Oral)   Ht 5\' 9"  (1.753 m)   Wt 214 lb 12.8 oz (97.4 kg)   SpO2 97%   BMI 31.72 kg/m  CONSTITUTIONAL: No acute distress, well nourished. HEENT:  Normocephalic, atraumatic, extraocular motion intact. NECK: Trachea is midline, and there is no jugular venous distension.  RESPIRATORY:  Lungs are clear, and breath sounds are equal bilaterally. Normal respiratory effort without pathologic use of  accessory muscles. CARDIOVASCULAR: Heart is regular without murmurs, gallops, or rubs. GI: The abdomen is soft, non-distended, non-tender to palpation.  The patient has an approximately 1.8 cm umbilical hernia which is reducible, likely containing fatty tissue.  He may also have a small right inguinal hernia, but not fully clear.  No hernia on the left groin.  MUSCULOSKELETAL:  Normal muscle strength and tone in all four extremities.  No peripheral edema or cyanosis. SKIN: Skin turgor is normal. There are no pathologic skin lesions.  NEUROLOGIC:  Motor and sensation is grossly normal.  Cranial nerves are grossly  intact. PSYCH:  Alert and oriented to person, place and time. Affect is normal.  Laboratory Analysis: Labs from 10/16/21: Na 139, K 4.5, Cr 1.1.  Tbili 0.9, AST 32, ALT 26, Alk Phos 54.  WBC 6.3, Hgb 17.3, Plt 261  Labs from 12/25/22: HbA1c 5.9  Imaging: No results found.  Assessment and Plan: This is a 57 y.o. male with a reducible umbilical hernia and possible right inguinal hernia.  --Discussed with the patient the categories for hernias including reducible, incarcerated, and strangulated.  His hernia is a reducible hernia and is overall asymptomatic unless he's doing more strenuous work.  Discussed with him the natural progression of hernias and that they can get bigger with time and perhaps result in more symptoms.  He is interested in surgical repair and I think given the low symptoms, would have flexibility on timing.  He would prefer to wait until October when his work will be lighter.  I think that is very reasonable. --Discussed with him that he may also have a right inguinal hernia, but is not fully clear on exam.  Recommended that he keep an eye on this area and we will also re-examine the right groin on his follow to check if he needs only an umbilical hernia repair or also an inguinal hernia repair.  Discussed briefly with him that this would be minimally invasive robotic surgery, that this would be an outpatient surgery, activity restrictions. --Follow up with me in about 4 months or sooner if any worsening symptoms.  I spent 30 minutes dedicated to the care of this patient on the date of this encounter to include pre-visit review of records, face-to-face time with the patient discussing diagnosis and management, and any post-visit coordination of care.   Howie Ill, MD [facility]

## 2023-02-11 DIAGNOSIS — G4733 Obstructive sleep apnea (adult) (pediatric): Secondary | ICD-10-CM | POA: Diagnosis not present

## 2023-03-11 ENCOUNTER — Telehealth: Payer: Self-pay | Admitting: Family Medicine

## 2023-03-11 NOTE — Telephone Encounter (Signed)
 Ok to schedule. Thanks

## 2023-03-11 NOTE — Telephone Encounter (Signed)
Patient would ike to know if Dr Reece Agar would take on his 57 year old son as a patient of his?

## 2023-03-12 NOTE — Telephone Encounter (Signed)
[email or web address]

## 2023-03-16 DIAGNOSIS — G4733 Obstructive sleep apnea (adult) (pediatric): Secondary | ICD-10-CM | POA: Diagnosis not present

## 2023-04-15 DIAGNOSIS — G4733 Obstructive sleep apnea (adult) (pediatric): Secondary | ICD-10-CM | POA: Diagnosis not present

## 2023-05-13 ENCOUNTER — Ambulatory Visit: Payer: BC Managed Care – PPO | Admitting: Surgery

## 2023-05-15 DIAGNOSIS — G4733 Obstructive sleep apnea (adult) (pediatric): Secondary | ICD-10-CM | POA: Diagnosis not present

## 2023-05-27 ENCOUNTER — Telehealth: Payer: Self-pay | Admitting: Surgery

## 2023-05-27 ENCOUNTER — Encounter: Payer: Self-pay | Admitting: Surgery

## 2023-05-27 ENCOUNTER — Ambulatory Visit (INDEPENDENT_AMBULATORY_CARE_PROVIDER_SITE_OTHER): Payer: BC Managed Care – PPO | Admitting: Surgery

## 2023-05-27 VITALS — BP 146/80 | HR 91 | Temp 98.0°F | Ht 69.5 in | Wt 222.0 lb

## 2023-05-27 DIAGNOSIS — K4090 Unilateral inguinal hernia, without obstruction or gangrene, not specified as recurrent: Secondary | ICD-10-CM | POA: Diagnosis not present

## 2023-05-27 DIAGNOSIS — K429 Umbilical hernia without obstruction or gangrene: Secondary | ICD-10-CM

## 2023-05-27 NOTE — Progress Notes (Signed)
05/27/2023  History of Present Illness: Jesse Phillips is a 57 y.o. male presenting for follow up of an umbilical hernia and possible right inguinal hernia.  He was last seen on 01/21/23 at which time he was interested in surgery but wanted to wait until around October when his work becomes lighter.  Today he denies any new or worsening symptoms.  His umbilical hernia continues to bulge and he wears a binder to help while at work.  There is minimal discomfort with the hernia, and it's localized to the umbilicus alone.  He denies any pain in either groin.  Past Medical History: Past Medical History:  Diagnosis Date   HLD (hyperlipidemia)    HTN (hypertension)    OSA on CPAP    compliant   Prediabetes    Vitamin D deficiency      Past Surgical History: Past Surgical History:  Procedure Laterality Date   NO PAST SURGERIES      Home Medications: Prior to Admission medications   Medication Sig Start Date End Date Taking? Authorizing Provider  Ascorbic Acid (VITAMIN C) 500 MG CAPS Take 1 capsule by mouth daily. 08/12/19  Yes Eustaquio Boyden, MD  atorvastatin (LIPITOR) 20 MG tablet Take 1 tablet (20 mg total) by mouth daily. 12/29/22  Yes Eustaquio Boyden, MD  Cholecalciferol (VITAMIN D) 2000 UNITS CAPS Take 1 capsule by mouth daily.   Yes [provider]  losartan (COZAAR) 50 MG tablet Take 1 tablet (50 mg total) by mouth daily. 12/29/22  Yes Eustaquio Boyden, MD  Multiple Vitamins-Minerals (CENTRUM ADULTS PO) Take 1 tablet by mouth daily.   Yes [provider]  OXYGEN Inhale into the lungs. Uses concentrator on Cpap at night   Yes [provider]  triamcinolone cream (KENALOG) 0.1 % Apply 1 application. topically 2 (two) times daily. 11/25/21  Yes Eustaquio Boyden, MD    Allergies: No Known Allergies  Review of Systems: Review of Systems  Constitutional:  Negative for chills and fever.  HENT:  Negative for hearing loss.   Respiratory:  Negative for  shortness of breath.   Cardiovascular:  Negative for chest pain.  Gastrointestinal:  Negative for abdominal pain, nausea and vomiting.  Genitourinary:  Negative for dysuria.  Musculoskeletal:  Negative for myalgias.  Skin:  Negative for rash.  Neurological:  Negative for dizziness.  Psychiatric/Behavioral:  Negative for depression.     Physical Exam BP (!) 146/80   Pulse 91   Temp 98 F (36.7 C)   Ht 5' 9.5" (1.765 m)   Wt 222 lb (100.7 kg)   SpO2 99%   BMI 32.31 kg/m  CONSTITUTIONAL: No acute distress, well nourished. HEENT:  Normocephalic, atraumatic, extraocular motion intact. RESPIRATORY:  Lungs are clear, and breath sounds are equal bilaterally. Normal respiratory effort without pathologic use of accessory muscles. CARDIOVASCULAR: Heart is regular without murmurs, gallops, or rubs. GI: The abdomen is soft, non-distended, non-tender to palpation.  Patient has a stable 1.8 cm umbilical hernia which remains reducible. On the right groin, it is still unclear if he has a hernia or not.  There was a sensation of bulging on palpation, but was not consistently reproducible with coughing or straining.  Nothing palpable in the left groin. NEUROLOGIC:  Motor and sensation is grossly normal.  Cranial nerves are grossly intact. PSYCH:  Alert and oriented to person, place and time. Affect is normal.  Labs/Imaging: Labs from 10/16/21: Na 139, K 4.5, Cr 1.1.  Tbili 0.9, AST 32, ALT 26, Alk Phos  54.  WBC 6.3, Hgb 17.3, Plt 261   Labs from 12/25/22: HbA1c 5.9  Assessment and Plan: This is a 57 y.o. male with an umbilical hernia and possible right inguinal hernia.  --Discussed with the patient that his umbilical hernia remains stable and reducible.  He has mild discomfort only when doing strenuous activity.  He's wearing an abdominal binder to help as well.  Discussed with him again the natural progression of hernias and that with time this could get bigger or cause more symptoms.  He's still  interested in surgical repair.   --With regards to the right groin, since it's unclear on my exam if he has a hernia or not, discussed with him that we would order an ultrasound of the right groin to further evaluate.  If there's no hernia there, then the main focus would be on a robotic assisted umbilical hernia repair.  If there is a hernia in the right groin, the surgery would change to a robotic assisted right inguinal hernia repair with open umbilical hernia repair.  I will contact him with the results of the ultrasound and to inform of the final surgical plan. --Discussed with him both surgeries at length including the planned incisions for both, the risks of bleeding, infection, injury to surrounding structures, the use of mesh, that this would be an outpatient surgery, post-operative activity restrictions, pain control, and he's willing to proceed. --He would like to schedule his surgery the week before Thanksgiving.  Will schedule at his request for 07/14/23.  All of his questions have been answered.  I spent 40 minutes dedicated to the care of this patient on the date of this encounter to include pre-visit review of records, face-to-face time with the patient discussing diagnosis and management, and any post-visit coordination of care.   Howie Ill, MD [facility]

## 2023-05-27 NOTE — Telephone Encounter (Signed)
Patient has been advised of Pre-Admission date/time, and Surgery date at [facility].  Surgery Date: 07/14/23 Preadmission Testing Date: 07/06/23 (phone 8a-1p)  Patient has been made aware to call [phone], between 1-3:00pm the day before surgery, to find out what time to arrive for surgery.

## 2023-05-27 NOTE — Patient Instructions (Addendum)
We will get you scheduled for an ultrasound to assess your groin area for any hernias.  Lo programaremos para una ecografa para evaluar el rea de la ingle en busca de hernias.  You are scheduled for an Ultrasound at [facility] on Tuesday October 8th. You will need to arrive at the Medical Mall entrance at 7:30 am.  We will call you with the results.  Tiene programada Sherlyn Lees en [facility] el martes 8 de [place]. Deber llegar a la entrada del Medical Mall a las 7:30 am.  Conley Rolls llamaremos con [place].  You have chose to have your hernia repaired. This will be done by Dr. Aleen Campi at [facility].  Please see your (blue) Pre-care information that you have been given today. Our surgery scheduler will call you to verify surgery date and to go over information.   You will need to arrange to be out of work for approximately 1-2 weeks and then you may return with a lifting restriction for 4 more weeks. If you have FMLA or Disability paperwork that needs to be filled out, please have your company fax your paperwork to [phone] or you may drop this by either office. This paperwork will be filled out within 3 days after your surgery has been completed.  You may have a bruise in your groin and also swelling and brusing in your testicle area. You may use ice 4-5 times daily for 15-20 minutes each time. Make sure that you place a barrier between you and the ice pack. To decrease the swelling, you may roll up a bath towel and place it vertically in between your thighs with your testicles resting on the towel. You will want to keep this area elevated as much as possible for several days following surgery.  Ha elegido que le reparen la hernia. Esto lo har el Dr. Aleen Campi en [facility].  Consulte la informacin de atencin previa (azul) que le han proporcionado hoy. Nuestro programador de [place] lo llamar para verificar la fecha de la ciruga y repasar la informacin.   Deber hacer arreglos para estar  sin trabajo durante aproximadamente 1 a 2 semanas y luego podr regresar con una restriccin de levantamiento de pesas durante 4 semanas ms. Si tiene documentacin [employer] o [employer]apacidad que debe completarse, pdale a su empresa que enve su documentacin por fax al [phone] o puede entregarla en cualquiera de las oficinas. Esta documentacin se completar dentro de los [address] a que se haya completado su [place].  Es posible que tenga un hematoma en la ingle y tambin hinchazn y hematomas en el rea de los testculos. Puede usar hielo de 4 a 5 veces al da durante 15 a 20 minutos cada vez. Asegrese de colocar una barrera entre usted y la bolsa de hielo. Para disminuir la hinchazn, puede enrollar una toalla de bao y colocarla verticalmente entre los muslos con los testculos apoyados sobre la toalla. Querr mantener esta rea elevada tanto como sea posible durante varios das despus de la [place].  Reparacin laparoscpica de hernia inguinal en los adultos Laparoscopic Inguinal Hernia Repair, Adult La reparacin laparoscpica de hernia inguinal es un procedimiento quirrgico que se realiza para reparar un pequeo punto dbil en los msculos de la ingle por el que sobresalen la grasa o los intestinos desde el interior del abdomen (hernia inguinal). Puede ser un procedimiento programado o un procedimiento de emergencia. Durante el procedimiento, el tejido sobresalido se vuelve a Curator, y se  repara la abertura en los msculos de la ingle. Esto se realiza a travs de tres incisiones pequeas en el abdomen. Se utiliza un tubo delgado que tiene una luz y una cmara en el extremo (laparoscopio) como ayuda para Sales executive procedimiento. Informe al mdico acerca de lo siguiente: Cualquier alergia que tenga. Todos los [employer] [place], incluidos vitaminas, hierbas, gotas oftlmicas, cremas y [address] de [address]. Problemas previos que usted o algn miembro de su  familia hayan tenido con los anestsicos. Cualquier trastorno de la sangre que tenga. Cirugas a las que se haya sometido. Cualquier afeccin mdica que tenga. Si est embarazada o podra estarlo. Cules son los riesgos? En general, se trata de un procedimiento seguro. Sin embargo, pueden ocurrir complicaciones, por ejemplo: Infeccin. Sangrado. Reacciones alrgicas a los medicamentos. Daos a las Environmental consultant o a los Tax inspector. Dao testicular o dolor a largo plazo e hinchazn del escroto, en los hombres. Imposibilidad de vaciar la vejiga por completo (retencin urinaria). Cogulos de [place]. Una acumulacin de lquido debajo de la piel (seroma). El regreso de la hernia (recurrencia). Qu ocurre antes del procedimiento? Medicamentos Consulte al mdico sobre: Multimedia programmer o suspender los medicamentos que [place] habitualmente. Esto es muy importante si toma medicamentos para la diabetes o anticoagulantes. Tomar medicamentos como aspirina e ibuprofeno. Estos medicamentos pueden tener un efecto anticoagulante en la [place]. No tome estos medicamentos a menos que el mdico se lo indique. Usar medicamentos de venta libre, vitaminas, hierbas y suplementos. Instrucciones generales No consuma ningn producto que contenga nicotina ni tabaco durante al menos las 4 semanas anteriores al procedimiento, de ser posible. Estos productos incluyen cigarrillos, tabaco para Theatre manager y aparatos de vapeo, como los Administrator, Civil Service. Si necesita ayuda para dejar de fumar, consulte al [employer]. Pregntele al mdico: Cmo se Forensic psychologist de la Leisure centre manager. Qu medidas se tomarn para evitar una infeccin. Estas medidas pueden incluir las siguientes: Rasurar el vello del lugar de la [place]. Lavar la piel con un jabn antisptico. Recibir antibiticos. Haga que un adulto responsable lo lleve a su casa desde el hospital o la clnica. Haga que un adulto responsable lo cuide durante el tiempo que le indiquen  despus de que le den el alta del hospital o de la clnica. Esto es importante. Qu ocurre durante el procedimiento? Le colocarn una va intravenosa en una vena. Le administrarn uno o ms de los siguientes medicamentos: Un medicamento para ayudar a Lexicographer (sedante). Un medicamento que lo har dormir (anestesia general). Le harn tres incisiones pequeas en el abdomen. Le inflarn el abdomen con un gas llamado dixido de carbono para poder ver el rea quirrgica con ms facilidad. Se introducirn el laparoscopio y los instrumentos quirrgicos a travs de las incisiones. El laparoscopio enviar imgenes del interior del abdomen a una pantalla que estar en el [place]. El tejido que sobresale de la hernia puede extirparse o volverse a Curator. La abertura de la hernia se cerrar con Neomia Dear lmina de [place]. Se retirarn el laparoscopio y los instrumentos quirrgicos. Las incisiones se cerrarn con puntos (suturas) y [place]. Se colocar una venda (vendaje) sobre las incisiones. Este procedimiento puede variar segn el mdico y el hospital. Ladell Heads ocurre despus del procedimiento? Le controlarn la presin arterial, la frecuencia cardaca, la frecuencia respiratoria y Air cabin crew de oxgeno en la sangre hasta que le den el alta del hospital o la clnica. Le darn medicamentos para el dolor si los necesita. Puede seguir recibiendo lquidos y [employer] por  una va intravenosa. Se retirar la va intravenosa una vez que pueda ingerir lquidos. Le recomendarn que se levante y se mueva, y que respire profundamente con frecuencia. Si le administraron un sedante durante el procedimiento, puede afectarlo por varias horas. No conduzca ni opere maquinaria hasta que el mdico le indique que es seguro [place]. Resumen La reparacin laparoscpica de hernia inguinal es un procedimiento quirrgico que se realiza para reparar un pequeo punto dbil en los msculos de la ingle por  el que sobresalen la grasa o los intestinos desde el interior del abdomen (hernia inguinal). Este procedimiento se realiza a travs de tres incisiones pequeas en el abdomen. Se utiliza un tubo delgado que tiene una luz y una cmara en el extremo (laparoscopio) como ayuda para Sales executive procedimiento. Despus del procedimiento, le recomendarn que se levante y se mueva, y que respire profundamente con frecuencia. Esta informacin no tiene Theme park manager el consejo del mdico. Asegrese de hacerle al mdico cualquier pregunta que tenga. Document Revised: 06/11/2020 Document Reviewed: 04/27/2020 Elsevier Patient Education  2024 [employer].

## 2023-06-02 ENCOUNTER — Ambulatory Visit
Admission: RE | Admit: 2023-06-02 | Discharge: 2023-06-02 | Disposition: A | Discharge status: Home / Self Care | Payer: BC Managed Care – PPO | Source: Ambulatory Visit | Attending: Surgery | Admitting: Surgery

## 2023-06-02 DIAGNOSIS — K4090 Unilateral inguinal hernia, without obstruction or gangrene, not specified as recurrent: Secondary | ICD-10-CM | POA: Diagnosis not present

## 2023-06-02 DIAGNOSIS — R102 Pelvic and perineal pain: Secondary | ICD-10-CM | POA: Diagnosis not present

## 2023-06-12 NOTE — Progress Notes (Signed)
06/12/23 Called patient to discuss ultrasound results.  Overall there is no clear evidence of a right inguinal hernia.  For now, will continue current surgical plan for a robotic umbilical hernia repair on 07/14/23.  Henrene Dodge, MD

## 2023-06-15 DIAGNOSIS — G4733 Obstructive sleep apnea (adult) (pediatric): Secondary | ICD-10-CM | POA: Diagnosis not present

## 2023-07-06 ENCOUNTER — Encounter
Admission: RE | Admit: 2023-07-06 | Discharge: 2023-07-06 | Disposition: A | Discharge status: Home / Self Care | Payer: BC Managed Care – PPO | Source: Ambulatory Visit | Attending: Surgery | Admitting: Surgery

## 2023-07-06 VITALS — Ht 69.5 in | Wt 222.0 lb

## 2023-07-06 DIAGNOSIS — Z01810 Encounter for preprocedural cardiovascular examination: Secondary | ICD-10-CM

## 2023-07-06 DIAGNOSIS — Z01812 Encounter for preprocedural laboratory examination: Secondary | ICD-10-CM

## 2023-07-06 DIAGNOSIS — I10 Essential (primary) hypertension: Secondary | ICD-10-CM

## 2023-07-06 HISTORY — DX: Other cervical disc degeneration, unspecified cervical region: M50.30

## 2023-07-06 HISTORY — DX: Umbilical hernia without obstruction or gangrene: K42.9

## 2023-07-06 HISTORY — DX: Anxiety disorder, unspecified: F41.9

## 2023-07-06 HISTORY — DX: Headache, unspecified: R51.9

## 2023-07-06 HISTORY — DX: Gastro-esophageal reflux disease without esophagitis: K21.9

## 2023-07-06 HISTORY — DX: Cardiac murmur, unspecified: R01.1

## 2023-07-06 HISTORY — DX: Prediabetes: R73.03

## 2023-07-06 NOTE — Patient Instructions (Addendum)
Your procedure is scheduled on:07-14-23 Tuesday Report to the Registration Desk on the 1st floor of the Medical Mall.Then proceed to the 2nd floor Surgery Desk To find out your arrival time, please call [phone] between 1PM - 3PM on:07-13-23 Monday If your arrival time is 6:00 am, do not arrive before that time as the Medical Mall entrance doors do not open until 6:00 am.  REMEMBER: Instructions that are not followed completely may result in serious medical risk, up to and including death; or upon the discretion of your surgeon and anesthesiologist your surgery may need to be rescheduled.  Do not eat food after midnight the night before surgery.  No gum chewing or hard candies.  You may however, drink CLEAR liquids up to 2 hours before you are scheduled to arrive for your surgery. Do not drink anything within 2 hours of your scheduled arrival time.  Clear liquids include: - water  - apple juice without pulp - gatorade (not RED colors) - black coffee or tea (Do NOT add milk or creamers to the coffee or tea) Do NOT drink anything that is not on this list.  One week prior to surgery:Stop NOW (07-06-23) Stop Anti-inflammatories (NSAIDS) such as Advil, Aleve, Ibuprofen, Motrin, Naproxen, Naprosyn and Aspirin based products such as Excedrin, Goody's Powder, BC Powder. Stop ANY OVER THE COUNTER supplements until after surgery (Vitamin C, Vitamin D, Multivitamin)  You may however, continue to take Tylenol if needed for pain up until the day of surgery.  Continue taking all of your other prescription medications up until the day of surgery.  ON THE DAY OF SURGERY ONLY TAKE THESE MEDICATIONS WITH SIPS OF WATER: -atorvastatin (LIPITOR)   No Alcohol for 24 hours before or after surgery.  No Smoking including e-cigarettes for 24 hours before surgery.  No chewable tobacco products for at least 6 hours before surgery.  No nicotine patches on the day of surgery.  Do not use any  "recreational" drugs for at least a week (preferably 2 weeks) before your surgery.  Please be advised that the combination of cocaine and anesthesia may have negative outcomes, up to and including death. If you test positive for cocaine, your surgery will be cancelled.  On the morning of surgery brush your teeth with toothpaste and water, you may rinse your mouth with mouthwash if you wish. Do not swallow any toothpaste or mouthwash.  Use CHG Soap as directed on instruction sheet.  Do not wear jewelry, make-up, hairpins, clips or nail polish.  For welded (permanent) jewelry: bracelets, anklets, waist bands, etc.  Please have this removed prior to surgery.  If it is not removed, there is a chance that hospital personnel will need to cut it off on the day of surgery.  Do not wear lotions, powders, or perfumes.   Do not shave body hair from the neck down 48 hours before surgery.  Contact lenses, hearing aids and dentures may not be worn into surgery.  Do not bring valuables to the hospital. [facility] is not responsible for any missing/lost belongings or valuables.   Bring your C-PAP to the hospital in case you may have to spend the night.   Notify your doctor if there is any change in your medical condition (cold, fever, infection).  Wear comfortable clothing (specific to your surgery type) to the hospital.  After surgery, you can help prevent lung complications by doing breathing exercises.  Take deep breaths and cough every 1-2 hours. Your doctor may order a  device called an Incentive Spirometer to help you take deep breaths. When coughing or sneezing, hold a pillow firmly against your incision with both hands. This is called "splinting." Doing this helps protect your incision. It also decreases belly discomfort.  If you are being admitted to the hospital overnight, leave your suitcase in the car. After surgery it may be brought to your room.  In case of increased patient census, it  may be necessary for you, the patient, to continue your postoperative care in the Same Day Surgery department.  If you are being discharged the day of surgery, you will not be allowed to drive home. You will need a responsible individual to drive you home and stay with you for 24 hours after surgery.   If you are taking public transportation, you will need to have a responsible individual with you.  Please call the Pre-admissions Testing Dept. at [phone] if you have any questions about these instructions.  Surgery Visitation Policy:  Patients having surgery or a procedure may have two visitors.  Children under the age of 21 must have an adult with them who is not the patient.     Preparing for Surgery with CHLORHEXIDINE GLUCONATE (CHG) Soap  Chlorhexidine Gluconate (CHG) Soap  o An antiseptic cleaner that kills germs and bonds with the skin to continue killing germs even after washing  o Used for showering the night before surgery and morning of surgery  Before surgery, you can play an important role by reducing the number of germs on your skin.  CHG (Chlorhexidine gluconate) soap is an antiseptic cleanser which kills germs and bonds with the skin to continue killing germs even after washing.  Please do not use if you have an allergy to CHG or antibacterial soaps. If your skin becomes reddened/irritated stop using the CHG.  1. Shower the NIGHT BEFORE SURGERY and the MORNING OF SURGERY with CHG soap.  2. If you choose to wash your hair, wash your hair first as usual with your normal shampoo.  3. After shampooing, rinse your hair and body thoroughly to remove the shampoo.  4. Use CHG as you would any other liquid soap. You can apply CHG directly to the skin and wash gently with a scrungie or a clean washcloth.  5. Apply the CHG soap to your body only from the neck down. Do not use on open wounds or open sores. Avoid contact with your eyes, ears, mouth, and genitals (private  parts). Wash face and genitals (private parts) with your normal soap.  6. Wash thoroughly, paying special attention to the area where your surgery will be performed.  7. Thoroughly rinse your body with warm water.  8. Do not shower/wash with your normal soap after using and rinsing off the CHG soap.  9. Pat yourself dry with a clean towel.  10. Wear clean pajamas to bed the night before surgery.  12. Place clean sheets on your bed the night of your first shower and do not sleep with pets.  13. Shower again with the CHG soap on the day of surgery prior to arriving at the hospital.  14. Do not apply any deodorants/lotions/powders.  15. Please wear clean clothes to the hospital.

## 2023-07-07 ENCOUNTER — Encounter
Admission: RE | Admit: 2023-07-07 | Discharge: 2023-07-07 | Disposition: A | Discharge status: Home / Self Care | Payer: BC Managed Care – PPO | Source: Ambulatory Visit | Attending: Surgery | Admitting: Surgery

## 2023-07-07 DIAGNOSIS — Z01818 Encounter for other preprocedural examination: Secondary | ICD-10-CM | POA: Insufficient documentation

## 2023-07-07 DIAGNOSIS — I10 Essential (primary) hypertension: Secondary | ICD-10-CM | POA: Diagnosis not present

## 2023-07-07 DIAGNOSIS — I440 Atrioventricular block, first degree: Secondary | ICD-10-CM | POA: Diagnosis not present

## 2023-07-07 DIAGNOSIS — Z01810 Encounter for preprocedural cardiovascular examination: Secondary | ICD-10-CM | POA: Diagnosis not present

## 2023-07-07 DIAGNOSIS — Z01812 Encounter for preprocedural laboratory examination: Secondary | ICD-10-CM

## 2023-07-07 LAB — BASIC METABOLIC PANEL
Anion gap: 7 (ref 5–15)
BUN: 18 mg/dL (ref 6–20)
CO2: 27 mmol/L (ref 22–32)
Calcium: 9.1 mg/dL (ref 8.9–10.3)
Chloride: 102 mmol/L (ref 98–111)
Creatinine, Ser: 1.05 mg/dL (ref 0.61–1.24)
GFR, Estimated: >60 mL/min (ref >60)
Glucose, Bld: 106 mg/dL — ABNORMAL HIGH (ref 70–99)
Potassium: 4.1 mmol/L (ref 3.5–5.1)
Sodium: 136 mmol/L (ref 135–145)

## 2023-07-07 LAB — CBC
HCT: 49.6 % (ref 39.0–52.0)
Hemoglobin: 16.7 g/dL (ref 13.0–17.0)
MCH: 29.6 pg (ref 26.0–34.0)
MCHC: 33.7 g/dL (ref 30.0–36.0)
MCV: 87.8 fL (ref 80.0–100.0)
Platelets: 246 10*3/uL (ref 150–400)
RBC: 5.65 MIL/uL (ref 4.22–5.81)
RDW: 13.6 % (ref 11.5–15.5)
WBC: 6.8 10*3/uL (ref 4.0–10.5)
nRBC: 0 % (ref 0.0–0.2)

## 2023-07-13 MED ORDER — ORAL CARE MOUTH RINSE: 15.0000 mL | Freq: Once | OROMUCOSAL | Status: AC

## 2023-07-13 MED ORDER — CHLORHEXIDINE GLUCONATE CLOTH 2 % EX PADS
6.0000 | MEDICATED_PAD | Freq: Once | CUTANEOUS | Status: AC
  Administered 2023-07-14: 6 via TOPICAL

## 2023-07-13 MED ORDER — ACETAMINOPHEN 500 MG PO TABS
1000.0000 mg | ORAL_TABLET | ORAL | Status: AC
  Administered 2023-07-14: 1000 mg via ORAL

## 2023-07-13 MED ORDER — CHLORHEXIDINE GLUCONATE 0.12 % MT SOLN
15.0000 mL | Freq: Once | OROMUCOSAL | Status: AC
  Administered 2023-07-14: 15 mL via OROMUCOSAL

## 2023-07-13 MED ORDER — CEFAZOLIN SODIUM-DEXTROSE 2-4 GM/100ML-% IV SOLN
2.0000 g | INTRAVENOUS | Status: AC
  Administered 2023-07-14: 2 g via INTRAVENOUS

## 2023-07-13 MED ORDER — LACTATED RINGERS IV SOLN: INTRAVENOUS | Status: DC

## 2023-07-13 MED ORDER — GABAPENTIN 300 MG PO CAPS
300.0000 mg | ORAL_CAPSULE | ORAL | Status: AC
  Administered 2023-07-14: 300 mg via ORAL

## 2023-07-13 MED ORDER — BUPIVACAINE LIPOSOME 1.3 % IJ SUSP: 20.0000 mL | Freq: Once | INTRAMUSCULAR | Status: DC

## 2023-07-14 ENCOUNTER — Ambulatory Visit: Payer: Self-pay

## 2023-07-14 ENCOUNTER — Encounter: Payer: Self-pay | Admitting: Surgery

## 2023-07-14 ENCOUNTER — Ambulatory Visit
Admission: RE | Admit: 2023-07-14 | Discharge: 2023-07-14 | Disposition: A | Discharge status: Home / Self Care | Payer: BC Managed Care – PPO | Attending: Surgery | Admitting: Surgery

## 2023-07-14 ENCOUNTER — Other Ambulatory Visit: Payer: Self-pay

## 2023-07-14 ENCOUNTER — Encounter
Admission: RE | Disposition: A | Discharge status: Home / Self Care | Payer: Self-pay | Source: Home / Self Care | Attending: Surgery

## 2023-07-14 ENCOUNTER — Ambulatory Visit: Payer: BC Managed Care – PPO

## 2023-07-14 DIAGNOSIS — G4730 Sleep apnea, unspecified: Secondary | ICD-10-CM | POA: Diagnosis not present

## 2023-07-14 DIAGNOSIS — K429 Umbilical hernia without obstruction or gangrene: Secondary | ICD-10-CM

## 2023-07-14 DIAGNOSIS — I10 Essential (primary) hypertension: Secondary | ICD-10-CM | POA: Insufficient documentation

## 2023-07-14 DIAGNOSIS — K4090 Unilateral inguinal hernia, without obstruction or gangrene, not specified as recurrent: Secondary | ICD-10-CM

## 2023-07-14 HISTORY — PX: LAPAROSCOPIC INCISIONAL / UMBILICAL / VENTRAL HERNIA REPAIR: SUR789

## 2023-07-14 SURGERY — REPAIR, HERNIA, UMBILICAL, ROBOT-ASSISTED
Anesthesia: General | Site: Abdomen

## 2023-07-14 MED ORDER — MIDAZOLAM HCL 2 MG/2ML IJ SOLN
INTRAMUSCULAR | Status: DC | PRN
  Administered 2023-07-14: 2 mg via INTRAVENOUS

## 2023-07-14 MED ORDER — KETOROLAC TROMETHAMINE 30 MG/ML IJ SOLN
INTRAMUSCULAR | Status: DC | PRN
  Administered 2023-07-14: 30 mg via INTRAVENOUS

## 2023-07-14 MED ORDER — 0.9 % SODIUM CHLORIDE (POUR BTL) OPTIME
TOPICAL | Status: DC | PRN
  Administered 2023-07-14: 500 mL

## 2023-07-14 MED ORDER — GABAPENTIN 300 MG PO CAPS
ORAL_CAPSULE | ORAL | Status: AC
  Filled 2023-07-14: qty 1

## 2023-07-14 MED ORDER — GLYCOPYRROLATE 0.2 MG/ML IJ SOLN
INTRAMUSCULAR | Status: DC | PRN
  Administered 2023-07-14: .2 mg via INTRAVENOUS

## 2023-07-14 MED ORDER — IBUPROFEN 600 MG PO TABS: 600.0000 mg | ORAL_TABLET | Freq: Three times a day (TID) | ORAL | 1 refills | Status: AC | PRN

## 2023-07-14 MED ORDER — DEXAMETHASONE SODIUM PHOSPHATE 10 MG/ML IJ SOLN
INTRAMUSCULAR | Status: DC | PRN
  Administered 2023-07-14: 10 mg via INTRAVENOUS

## 2023-07-14 MED ORDER — ALBUMIN HUMAN 5 % IV SOLN: INTRAVENOUS | Status: DC | PRN

## 2023-07-14 MED ORDER — BUPIVACAINE-EPINEPHRINE 0.25% -1:200000 IJ SOLN: INTRAMUSCULAR | Status: DC | PRN

## 2023-07-14 MED ORDER — PROPOFOL 10 MG/ML IV BOLUS
INTRAVENOUS | Status: AC
  Filled 2023-07-14: qty 20

## 2023-07-14 MED ORDER — SODIUM CHLORIDE FLUSH 0.9 % IV SOLN
INTRAVENOUS | Status: AC
  Filled 2023-07-14: qty 10

## 2023-07-14 MED ORDER — ACETAMINOPHEN 500 MG PO TABS
ORAL_TABLET | ORAL | Status: AC
  Filled 2023-07-14: qty 2

## 2023-07-14 MED ORDER — CEFAZOLIN SODIUM-DEXTROSE 2-4 GM/100ML-% IV SOLN
INTRAVENOUS | Status: AC
  Filled 2023-07-14: qty 100

## 2023-07-14 MED ORDER — CHLORHEXIDINE GLUCONATE 0.12 % MT SOLN
OROMUCOSAL | Status: AC
  Filled 2023-07-14: qty 15

## 2023-07-14 MED ORDER — PHENYLEPHRINE 80 MCG/ML (10ML) SYRINGE FOR IV PUSH (FOR BLOOD PRESSURE SUPPORT)
PREFILLED_SYRINGE | INTRAVENOUS | Status: DC | PRN
  Administered 2023-07-14: 120 ug via INTRAVENOUS
  Administered 2023-07-14: 80 ug via INTRAVENOUS

## 2023-07-14 MED ORDER — FENTANYL CITRATE (PF) 100 MCG/2ML IJ SOLN
INTRAMUSCULAR | Status: AC
  Filled 2023-07-14: qty 2

## 2023-07-14 MED ORDER — SODIUM CHLORIDE (PF) 0.9 % IJ SOLN
INTRAMUSCULAR | Status: DC | PRN
  Administered 2023-07-14: 60 mL via INTRAMUSCULAR

## 2023-07-14 MED ORDER — ONDANSETRON HCL 4 MG/2ML IJ SOLN
INTRAMUSCULAR | Status: DC | PRN
  Administered 2023-07-14: 4 mg via INTRAVENOUS

## 2023-07-14 MED ORDER — PHENYLEPHRINE HCL-NACL 20-0.9 MG/250ML-% IV SOLN
INTRAVENOUS | Status: DC | PRN
Start: 2023-07-14 — End: 2023-07-14
  Administered 2023-07-14: 15 ug/min via INTRAVENOUS

## 2023-07-14 MED ORDER — EPHEDRINE SULFATE-NACL 50-0.9 MG/10ML-% IV SOSY
PREFILLED_SYRINGE | INTRAVENOUS | Status: DC | PRN
  Administered 2023-07-14: 5 mg via INTRAVENOUS
  Administered 2023-07-14: 2.5 mg via INTRAVENOUS

## 2023-07-14 MED ORDER — KETAMINE HCL 50 MG/5ML IJ SOSY
PREFILLED_SYRINGE | INTRAMUSCULAR | Status: DC | PRN
  Administered 2023-07-14: 25 mg via INTRAVENOUS

## 2023-07-14 MED ORDER — MIDAZOLAM HCL 2 MG/2ML IJ SOLN
INTRAMUSCULAR | Status: AC
  Filled 2023-07-14: qty 2

## 2023-07-14 MED ORDER — BUPIVACAINE LIPOSOME 1.3 % IJ SUSP
INTRAMUSCULAR | Status: AC
  Filled 2023-07-14: qty 20

## 2023-07-14 MED ORDER — FENTANYL CITRATE (PF) 100 MCG/2ML IJ SOLN
INTRAMUSCULAR | Status: DC | PRN
  Administered 2023-07-14 (×2): 50 ug via INTRAVENOUS

## 2023-07-14 MED ORDER — PROPOFOL 10 MG/ML IV BOLUS
INTRAVENOUS | Status: DC | PRN
  Administered 2023-07-14: 50 mg via INTRAVENOUS
  Administered 2023-07-14: 150 mg via INTRAVENOUS

## 2023-07-14 MED ORDER — LIDOCAINE HCL (CARDIAC) PF 100 MG/5ML IV SOSY
PREFILLED_SYRINGE | INTRAVENOUS | Status: DC | PRN
  Administered 2023-07-14: 80 mg via INTRAVENOUS

## 2023-07-14 MED ORDER — ROCURONIUM BROMIDE 100 MG/10ML IV SOLN
INTRAVENOUS | Status: DC | PRN
  Administered 2023-07-14: 20 mg via INTRAVENOUS
  Administered 2023-07-14: 50 mg via INTRAVENOUS

## 2023-07-14 MED ORDER — SUGAMMADEX SODIUM 200 MG/2ML IV SOLN
INTRAVENOUS | Status: DC | PRN
  Administered 2023-07-14: 200 mg via INTRAVENOUS

## 2023-07-14 MED ORDER — FENTANYL CITRATE (PF) 100 MCG/2ML IJ SOLN: 25.0000 ug | INTRAMUSCULAR | Status: DC | PRN

## 2023-07-14 MED ORDER — KETOROLAC TROMETHAMINE 30 MG/ML IJ SOLN
INTRAMUSCULAR | Status: AC
  Filled 2023-07-14: qty 1

## 2023-07-14 MED ORDER — OXYCODONE HCL 5 MG/5ML PO SOLN: 5.0000 mg | Freq: Once | ORAL | Status: AC | PRN

## 2023-07-14 MED ORDER — BUPIVACAINE-EPINEPHRINE (PF) 0.5% -1:200000 IJ SOLN
INTRAMUSCULAR | Status: AC
  Filled 2023-07-14: qty 30

## 2023-07-14 MED ORDER — KETAMINE HCL 50 MG/5ML IJ SOSY
PREFILLED_SYRINGE | INTRAMUSCULAR | Status: AC
  Filled 2023-07-14: qty 5

## 2023-07-14 MED ORDER — OXYCODONE HCL 5 MG PO TABS
ORAL_TABLET | ORAL | Status: AC
  Filled 2023-07-14: qty 1

## 2023-07-14 MED ORDER — OXYCODONE HCL 5 MG PO TABS: 5.0000 mg | ORAL_TABLET | ORAL | 0 refills | Status: DC | PRN

## 2023-07-14 MED ORDER — ACETAMINOPHEN 500 MG PO TABS
1000.0000 mg | ORAL_TABLET | Freq: Four times a day (QID) | ORAL | Status: AC | PRN
Start: 2023-07-14 — End: ?

## 2023-07-14 MED ORDER — OXYCODONE HCL 5 MG PO TABS
5.0000 mg | ORAL_TABLET | Freq: Once | ORAL | Status: AC | PRN
  Administered 2023-07-14: 5 mg via ORAL

## 2023-07-14 MED ORDER — ALBUMIN HUMAN 5 % IV SOLN
INTRAVENOUS | Status: AC
  Filled 2023-07-14: qty 250

## 2023-07-14 SURGICAL SUPPLY — 58 items
BLADE SURG SZ11 CARB STEEL (BLADE) ×1 IMPLANT
CANNULA CAP OBTURATR AIRSEAL 8 (CAP) IMPLANT
COVER TIP SHEARS 8 DVNC (MISCELLANEOUS) ×1 IMPLANT
COVER WAND RF STERILE (DRAPES) ×1 IMPLANT
DERMABOND ADVANCED .7 DNX12 (GAUZE/BANDAGES/DRESSINGS) ×1 IMPLANT
DRAPE ARM DVNC X/XI (DISPOSABLE) ×3 IMPLANT
DRAPE COLUMN DVNC XI (DISPOSABLE) ×1 IMPLANT
ELECT CAUTERY BLADE TIP 2.5 (TIP) ×1
ELECT REM PT RETURN 9FT ADLT (ELECTROSURGICAL) ×1
ELECTRODE CAUTERY BLDE TIP 2.5 (TIP) ×1 IMPLANT
ELECTRODE REM PT RTRN 9FT ADLT (ELECTROSURGICAL) ×1 IMPLANT
FORCEPS BPLR R/ABLATION 8 DVNC (INSTRUMENTS) ×1 IMPLANT
GLOVE SURG SYN 7.0 (GLOVE) ×2
GLOVE SURG SYN 7.0 PF PI (GLOVE) ×2 IMPLANT
GLOVE SURG SYN 7.5 E (GLOVE) ×2
GLOVE SURG SYN 7.5 PF PI (GLOVE) ×2 IMPLANT
GOWN STRL REUS W/ TWL LRG LVL3 (GOWN DISPOSABLE) ×3 IMPLANT
GRASPER SUT TROCAR 14GX15 (MISCELLANEOUS) ×1 IMPLANT
IRRIGATION STRYKERFLOW (MISCELLANEOUS) IMPLANT
IRRIGATOR STRYKERFLOW (MISCELLANEOUS)
IV NS 1000ML BAXH (IV SOLUTION) IMPLANT
KIT PINK PAD W/HEAD ARE REST (MISCELLANEOUS) ×1
KIT PINK PAD W/HEAD ARM REST (MISCELLANEOUS) ×1 IMPLANT
LABEL OR SOLS (LABEL) ×1 IMPLANT
MANIFOLD NEPTUNE II (INSTRUMENTS) ×1 IMPLANT
MESH VENTRALIGHT ST 4.5 ECHO (Mesh General) IMPLANT
NDL DRIVE SUT CUT DVNC (INSTRUMENTS) ×1 IMPLANT
NDL HYPO 22X1.5 SAFETY MO (MISCELLANEOUS) ×1 IMPLANT
NDL INSUFFLATION 14GA 120MM (NEEDLE) ×1 IMPLANT
NDL SAFETY ECLIPSE 18X1.5 (NEEDLE) IMPLANT
NEEDLE DRIVE SUT CUT DVNC (INSTRUMENTS) ×1
NEEDLE HYPO 22X1.5 SAFETY MO (MISCELLANEOUS) ×1
NEEDLE INSUFFLATION 14GA 120MM (NEEDLE) ×1
NS IRRIG 500ML POUR BTL (IV SOLUTION) IMPLANT
OBTURATOR OPTICAL STND 8 DVNC (TROCAR) ×1
OBTURATOR OPTICALSTD 8 DVNC (TROCAR) ×1 IMPLANT
PACK LAP CHOLECYSTECTOMY (MISCELLANEOUS) ×1 IMPLANT
SCISSORS MNPLR CVD DVNC XI (INSTRUMENTS) ×1 IMPLANT
SEAL UNIV 5-12 XI (MISCELLANEOUS) ×2 IMPLANT
SET TUBE FILTERED XL AIRSEAL (SET/KITS/TRAYS/PACK) IMPLANT
SET TUBE SMOKE EVAC HIGH FLOW (TUBING) ×1 IMPLANT
SOL ELECTROSURG ANTI STICK (MISCELLANEOUS) ×1
SOLUTION ELECTROSURG ANTI STCK (MISCELLANEOUS) ×1 IMPLANT
SPONGE T-LAP 18X18 ~~LOC~~+RFID (SPONGE) ×1 IMPLANT
SUT MNCRL 4-0 27XMFL (SUTURE) ×1
SUT STRATA 2-0 30 CT-2 (SUTURE) ×2 IMPLANT
SUT STRATAFIX PDS 30 CT-1 (SUTURE) ×1 IMPLANT
SUT VIC AB 3-0 SH 27X BRD (SUTURE) IMPLANT
SUT VICRYL 0 UR6 27IN ABS (SUTURE) ×2 IMPLANT
SUT VLOC 90 2/L VL 12 GS22 (SUTURE) IMPLANT
SUTURE MNCRL 4-0 27XMF (SUTURE) ×1 IMPLANT
SYR 30ML LL (SYRINGE) IMPLANT
SYS BAG RETRIEVAL 10MM (BASKET) ×1
SYSTEM BAG RETRIEVAL 10MM (BASKET) IMPLANT
TAPE TRANSPORE STRL 2 31045 (GAUZE/BANDAGES/DRESSINGS) ×1 IMPLANT
TRAP FLUID SMOKE EVACUATOR (MISCELLANEOUS) ×1 IMPLANT
TRAY FOLEY SLVR 16FR LF STAT (SET/KITS/TRAYS/PACK) ×1 IMPLANT
WATER STERILE IRR 500ML POUR (IV SOLUTION) ×1 IMPLANT

## 2023-07-14 NOTE — Anesthesia Postprocedure Evaluation (Signed)
Anesthesia Post Note  Patient: Jesse Phillips  Procedure(s) Performed: XI ROBOT ASSISTED UMBILICAL HERNIA REPAIR (Abdomen)  Patient location during evaluation: PACU Anesthesia Type: General Level of consciousness: awake and alert Pain management: pain level controlled Vital Signs Assessment: post-procedure vital signs reviewed and stable Respiratory status: spontaneous breathing, nonlabored ventilation, respiratory function stable and patient connected to nasal cannula oxygen Cardiovascular status: blood pressure returned to baseline and stable Postop Assessment: no apparent nausea or vomiting Anesthetic complications: no   No notable events documented.   Last Vitals:  Vitals:   07/14/23 1600 07/14/23 1615  BP: 136/78 132/89  Pulse: 90 81  Resp: 16 11  Temp:    SpO2: 99% 90%    Last Pain:  Vitals:   07/14/23 1600  TempSrc:   PainSc: 0-No pain                 Louie Boston

## 2023-07-14 NOTE — Anesthesia Preprocedure Evaluation (Signed)
Anesthesia Evaluation  Patient identified by MRN, date of birth, ID band Patient awake    Reviewed: Allergy & Precautions, NPO status , Patient's Chart, lab work & pertinent test results  History of Anesthesia Complications Negative for: history of anesthetic complications  Airway Mallampati: III  TM Distance: <3 FB Neck ROM: full    Dental  (+) Chipped   Pulmonary neg shortness of breath, sleep apnea    Pulmonary exam normal        Cardiovascular Exercise Tolerance: Good hypertension, (-) angina (-) Past MI and (-) DOE Normal cardiovascular exam     Neuro/Psych  Headaches PSYCHIATRIC DISORDERS         GI/Hepatic Neg liver ROS,GERD  Controlled,,  Endo/Other  negative endocrine ROS    Renal/GU      Musculoskeletal   Abdominal   Peds  Hematology negative hematology ROS (+)   Anesthesia Other Findings Past Medical History: No date: Anxiety No date: DDD (degenerative disc disease), cervical No date: GERD (gastroesophageal reflux disease) No date: Headache No date: Heart murmur No date: HLD (hyperlipidemia) No date: HTN (hypertension) No date: OSA on CPAP     Comment:  compliant No date: Pre-diabetes No date: Umbilical hernia No date: Vitamin D deficiency  Past Surgical History: No date: NO PAST SURGERIES  BMI    Body Mass Index: 32.31 kg/m      Reproductive/Obstetrics negative OB ROS                             Anesthesia Physical Anesthesia Plan  ASA: 3  Anesthesia Plan: General ETT   Post-op Pain Management:    Induction: Intravenous  PONV Risk Score and Plan: Ondansetron, Dexamethasone, Midazolam and Treatment may vary due to age or medical condition  Airway Management Planned: Oral ETT  Additional Equipment:   Intra-op Plan:   Post-operative Plan: Extubation in OR  Informed Consent: I have reviewed the patients History and Physical, chart, labs and  discussed the procedure including the risks, benefits and alternatives for the proposed anesthesia with the patient or authorized representative who has indicated his/her understanding and acceptance.     Dental Advisory Given  Plan Discussed with: Anesthesiologist, CRNA and Surgeon  Anesthesia Plan Comments: (Patient consented for risks of anesthesia including but not limited to:  - adverse reactions to medications - damage to eyes, teeth, lips or other oral mucosa - nerve damage due to positioning  - sore throat or hoarseness - Damage to heart, brain, nerves, lungs, other parts of body or loss of life  Patient voiced understanding and assent.)       Anesthesia Quick Evaluation

## 2023-07-14 NOTE — Anesthesia Procedure Notes (Signed)
Procedure Name: Intubation Date/Time: 07/14/2023 2:01 PM  Performed by: Elisabeth Pigeon, CRNAPre-anesthesia Checklist: Patient identified, Patient being monitored, Timeout performed, Emergency Drugs available and Suction available Patient Re-evaluated:Patient Re-evaluated prior to induction Oxygen Delivery Method: Circle system utilized Preoxygenation: Pre-oxygenation with 100% oxygen Induction Type: IV induction Ventilation: Two handed mask ventilation required and Oral airway inserted - appropriate to patient size Laryngoscope Size: Mac, McGrath and 4 Grade View: Grade I Tube type: Oral Tube size: 7.5 mm Number of attempts: 1 Airway Equipment and Method: Stylet Placement Confirmation: ETT inserted through vocal cords under direct vision, positive ETCO2 and breath sounds checked- equal and bilateral Secured at: 23 cm Tube secured with: Tape Dental Injury: Teeth and Oropharynx as per pre-operative assessment

## 2023-07-14 NOTE — Discharge Instructions (Signed)
Discharge Instructions: 1.  Patient may shower, but do not scrub wounds heavily and dab dry only. 2.  Do not submerge wounds in pool/tub until fully healed. 3.  Do not apply ointments or hydrogen peroxide to the wounds. 4.  May apply ice packs to the wounds for comfort. 5.  Do not drive while taking narcotics for pain control.  Prior to driving, make sure you are able to rotate right and left to look at blindspots without significant pain or discomfort. 6.  No heavy lifting or pushing of more than 10-15 lbs for 4 weeks.  Then can start to slowly increase activity level.  At 6 weeks, can resume all activities without restrictions.

## 2023-07-14 NOTE — Transfer of Care (Signed)
Immediate Anesthesia Transfer of Care Note  Patient: Jesse Phillips  Procedure(s) Performed: XI ROBOT ASSISTED UMBILICAL HERNIA REPAIR (Abdomen)  Patient Location: PACU  Anesthesia Type:General  Level of Consciousness: awake, alert , and oriented  Airway & Oxygen Therapy: Patient Spontanous Breathing and Patient connected to face mask oxygen  Post-op Assessment: Report given to RN and Post -op Vital signs reviewed and stable  Post vital signs: Reviewed and stable  Last Vitals:  Vitals Value Taken Time  BP 144*90   Temp    Pulse 91 07/14/23 1555  Resp 14   SpO2 95 % 07/14/23 1555  Vitals shown include unfiled device data.  Last Pain:  Vitals:   07/14/23 1229  TempSrc: Temporal  PainSc: 0-No pain         Complications: No notable events documented.

## 2023-07-14 NOTE — H&P (Signed)
07/14/23   History of Present Illness: Jesse Phillips is a 57 y.o. male presenting for follow up of an umbilical hernia and possible right inguinal hernia.  He was last seen on 01/21/23 at which time he was interested in surgery but wanted to wait until around October when his work becomes lighter.  Today he denies any new or worsening symptoms.  His umbilical hernia continues to bulge and he wears a binder to help while at work.  There is minimal discomfort with the hernia, and it's localized to the umbilicus alone.  He denies any pain in either groin.   Past Medical History:     Past Medical History:  Diagnosis Date   HLD (hyperlipidemia)     HTN (hypertension)     OSA on CPAP      compliant   Prediabetes     Vitamin D deficiency            Past Surgical History:      Past Surgical History:  Procedure Laterality Date   NO PAST SURGERIES              Home Medications:        Prior to Admission medications   Medication Sig Start Date End Date Taking? Authorizing Provider  Ascorbic Acid (VITAMIN C) 500 MG CAPS Take 1 capsule by mouth daily. 08/12/19   Yes Eustaquio Boyden, MD  atorvastatin (LIPITOR) 20 MG tablet Take 1 tablet (20 mg total) by mouth daily. 12/29/22   Yes Eustaquio Boyden, MD  Cholecalciferol (VITAMIN D) 2000 UNITS CAPS Take 1 capsule by mouth daily.     Yes [provider]  losartan (COZAAR) 50 MG tablet Take 1 tablet (50 mg total) by mouth daily. 12/29/22   Yes Eustaquio Boyden, MD  Multiple Vitamins-Minerals (CENTRUM ADULTS PO) Take 1 tablet by mouth daily.     Yes [provider]  OXYGEN Inhale into the lungs. Uses concentrator on Cpap at night     Yes [provider]  triamcinolone cream (KENALOG) 0.1 % Apply 1 application. topically 2 (two) times daily. 11/25/21   Yes Eustaquio Boyden, MD      Allergies: Allergies  No Known Allergies     Review of Systems: Review of Systems  Constitutional:  Negative for chills and fever.   HENT:  Negative for hearing loss.   Respiratory:  Negative for shortness of breath.   Cardiovascular:  Negative for chest pain.  Gastrointestinal:  Negative for abdominal pain, nausea and vomiting.  Genitourinary:  Negative for dysuria.  Musculoskeletal:  Negative for myalgias.  Skin:  Negative for rash.  Neurological:  Negative for dizziness.  Psychiatric/Behavioral:  Negative for depression.       Physical Exam BP (!) 146/80   Pulse 91   Temp 98 F (36.7 C)   Ht 5' 9.5" (1.765 m)   Wt 222 lb (100.7 kg)   SpO2 99%   BMI 32.31 kg/m  CONSTITUTIONAL: No acute distress, well nourished. HEENT:  Normocephalic, atraumatic, extraocular motion intact. RESPIRATORY:  Lungs are clear, and breath sounds are equal bilaterally. Normal respiratory effort without pathologic use of accessory muscles. CARDIOVASCULAR: Heart is regular without murmurs, gallops, or rubs. GI: The abdomen is soft, non-distended, non-tender to palpation.  Patient has a stable 1.8 cm umbilical hernia which remains reducible. On the right groin, it is still unclear if he has a hernia or not.  There was a sensation of bulging on palpation, but was not consistently reproducible with coughing  or straining.  Nothing palpable in the left groin. NEUROLOGIC:  Motor and sensation is grossly normal.  Cranial nerves are grossly intact. PSYCH:  Alert and oriented to person, place and time. Affect is normal.   Labs/Imaging: Labs from 10/16/21: Na 139, K 4.5, Cr 1.1.  Tbili 0.9, AST 32, ALT 26, Alk Phos 54.  WBC 6.3, Hgb 17.3, Plt 261   Labs from 12/25/22: HbA1c 5.9   Assessment and Plan: This is a 57 y.o. male with an umbilical hernia and possible right inguinal hernia.   --Discussed with the patient that his umbilical hernia remains stable and reducible.  He has mild discomfort only when doing strenuous activity.  He's wearing an abdominal binder to help as well.  Discussed with him again the natural progression of hernias and that  with time this could get bigger or cause more symptoms.  He's still interested in surgical repair.   --With regards to the right groin, he had an ultrasound on 06/02/23 which did not show any evidence of a right inguinal hernia.  As such, we're proceeding only with the umbilical hernia repair. --Discussed with him the plan for a robotic assisted umbilical hernia repair and reviewed the surgery at length including the planned incisions for both, the risks of bleeding, infection, injury to surrounding structures, the use of mesh, that this would be an outpatient surgery, post-operative activity restrictions, pain control, and he's willing to proceed. --He would like to schedule his surgery the week before Thanksgiving.  Will schedule at his request for 07/14/23.  All of his questions have been answered.    Howie Ill, MD  [facility]

## 2023-07-14 NOTE — Op Note (Signed)
  Procedure Date:  07/14/2023  Pre-operative Diagnosis:  Reducible umbilical hernia  Post-operative Diagnosis: Reducible umbilical hernia, 2 cm.  Procedure:  Robotic assisted umbilical Hernia Repair with mesh  Surgeon:  Howie Ill, MD  Anesthesia:  General endotracheal  Estimated Blood Loss:  20 ml  Specimens:  None  Complications:  None  Indications for Procedure:  This is a 57 y.o. male who presents with an umbilical hernia.  The options of surgery versus observation were reviewed with the patient and/or family. The risks of bleeding, abscess or infection, recurrence of symptoms, potential for an open procedure, injury to surrounding structures, and chronic pain were all discussed with the patient and was willing to proceed.  Description of Procedure: The patient was correctly identified in the preoperative area and brought into the operating room.  The patient was placed supine with VTE prophylaxis in place.  Appropriate time-outs were performed.  Anesthesia was induced and the patient was intubated.  Appropriate antibiotics were infused.  The abdomen was prepped and draped in a sterile fashion. The patient's hernia defect was marked with a marking pen.  A Veress needle was introduced in the left upper quadrant and pneumoperitoneum was obtained with appropriate pressures.  Using Optiview technique, an 8 mm port was introduced in the left lateral abdominal wall without complications.  Then, a 12 mm port was introduced in the left upper quadrant and an 8 mm port in the left lower quadrant under direct visualization.  A 4.5 inch Bard Ventralight ST Echo mesh, a 0 Stratafix suture, and two 2-0 V-loc sutures were inserted through the 12 mm port under direct visualization.  The DaVinci platform was docked, camera targeted, and instruments placed under direct visualization.  The patient's hernia was fully reduced and the peritoneum and preperitoneal fat were dissected and resected to  allow better exposure of the hernia defect and for better mesh placement.  The hernia defect was closed using the stratafix suture.  A PMI was brought through the center of the hernia defect and the positioning system of the mesh was passed through.  This allowed the mesh to splay open and be centered over the repair site with good overlap.  The mesh was then sutured in place circumferentially and through the center of the mesh using the V-loc sutures.  All needles and the positioning system were then removed through the 12 mm port without complications.  The preperitoneal fat was placed in an endocatch bag.  The DaVinci platform was then undocked and instruments removed.    60 ml of Exparel solution mixed with 0.5% bupivacaine with epi was infiltrated around the mesh edges, hernia repair site, and port sites.  The 12 mm port was removed and the endocatch bag retrieved.  The fascia was closed under direct visualization utilizing an Endo Close technique with 0 Vicryl suture.  The 8 mm ports were removed. The 12 mm incision was closed using 3-0 Vicryl and 4-0 Monocryl, and the other port incisions were closed with 4-0 Monocryl.  The wounds were cleaned and sealed with DermaBond.  The patient was emerged from anesthesia and extubated and brought to the recovery room for further management.  The patient tolerated the procedure well and all counts were correct at the end of the case.   Howie Ill, MD

## 2023-07-15 DIAGNOSIS — G4733 Obstructive sleep apnea (adult) (pediatric): Secondary | ICD-10-CM | POA: Diagnosis not present

## 2023-07-27 ENCOUNTER — Ambulatory Visit (INDEPENDENT_AMBULATORY_CARE_PROVIDER_SITE_OTHER): Payer: BC Managed Care – PPO | Admitting: Surgery

## 2023-07-27 ENCOUNTER — Encounter: Payer: BC Managed Care – PPO | Admitting: Surgery

## 2023-07-27 ENCOUNTER — Encounter: Payer: Self-pay | Admitting: Surgery

## 2023-07-27 VITALS — BP 154/96 | HR 80 | Temp 98.9°F | Ht 69.5 in | Wt 221.6 lb

## 2023-07-27 DIAGNOSIS — K429 Umbilical hernia without obstruction or gangrene: Secondary | ICD-10-CM | POA: Diagnosis not present

## 2023-07-27 DIAGNOSIS — Z09 Encounter for follow-up examination after completed treatment for conditions other than malignant neoplasm: Secondary | ICD-10-CM

## 2023-07-27 NOTE — Patient Instructions (Signed)

## 2023-07-27 NOTE — Progress Notes (Signed)
07/27/2023  History of Present Illness: Jesse Phillips is a 57 y.o. male status post robotic assisted umbilical hernia repair with mesh on 07/14/2023.  Patient presents today for follow-up.  Patient reports that he has been doing well and having some mild soreness but no worsening pain.  He is wearing an abdominal binder to help with comfort.  Past Medical History: Past Medical History:  Diagnosis Date   Anxiety    DDD (degenerative disc disease), cervical    GERD (gastroesophageal reflux disease)    Headache    Heart murmur    HLD (hyperlipidemia)    HTN (hypertension)    OSA on CPAP    compliant   Pre-diabetes    Umbilical hernia    Vitamin D deficiency      Past Surgical History: Past Surgical History:  Procedure Laterality Date   LAPAROSCOPIC INCISIONAL / UMBILICAL / VENTRAL HERNIA REPAIR N/A 07/14/2023   umbilical hernia, 2 cm    Home Medications: Prior to Admission medications   Medication Sig Start Date End Date Taking? Authorizing Provider  acetaminophen (TYLENOL) 500 MG tablet Take 2 tablets (1,000 mg total) by mouth every 6 (six) hours as needed for mild pain (pain score 1-3). 07/14/23  Yes Timmothy Baranowski, Elita Quick, MD  Ascorbic Acid (VITAMIN C) 500 MG CAPS Take 1 capsule by mouth daily. 08/12/19  Yes Eustaquio Boyden, MD  atorvastatin (LIPITOR) 20 MG tablet Take 1 tablet (20 mg total) by mouth daily. Patient taking differently: Take 20 mg by mouth every morning. 12/29/22  Yes Eustaquio Boyden, MD  Cholecalciferol (VITAMIN D) 2000 UNITS CAPS Take 2,000 Units by mouth daily.   Yes [provider]  ibuprofen (ADVIL) 600 MG tablet Take 1 tablet (600 mg total) by mouth every 8 (eight) hours as needed for moderate pain (pain score 4-6). 07/14/23  Yes Donnabelle Blanchard, Elita Quick, MD  losartan (COZAAR) 50 MG tablet Take 1 tablet (50 mg total) by mouth daily. Patient taking differently: Take 50 mg by mouth every morning. 12/29/22  Yes Eustaquio Boyden, MD  Multiple Vitamins-Minerals  (CENTRUM ADULTS PO) Take 1 tablet by mouth daily.   Yes [provider]  OXYGEN Inhale into the lungs. Uses concentrator on Cpap at night   Yes [provider]  triamcinolone cream (KENALOG) 0.1 % Apply 1 application. topically 2 (two) times daily. 11/25/21  Yes Eustaquio Boyden, MD    Allergies: No Known Allergies  Review of Systems: Review of Systems  Constitutional:  Negative for chills and fever.  Respiratory:  Negative for shortness of breath.   Cardiovascular:  Negative for chest pain.  Gastrointestinal:  Positive for abdominal pain (soreness at umbilicus). Negative for nausea and vomiting.    Physical Exam BP (!) 154/96   Pulse 80   Temp 98.9 F (37.2 C) (Oral)   Ht 5' 9.5" (1.765 m)   Wt 221 lb 9.6 oz (100.5 kg)   SpO2 96%   BMI 32.26 kg/m  CONSTITUTIONAL: No acute distress HEENT:  Normocephalic, atraumatic, extraocular motion intact. RESPIRATORY:  Normal respiratory effort without pathologic use of accessory muscles. CARDIOVASCULAR: Regular rhythm and rate. GI: The abdomen is soft, nondistended, appropriately sore to palpation.  Left sided incisions are healing well and are clean, dry, intact.  On palpation of the umbilicus, there is some appropriate soreness but there is no evidence of any hernia recurrence. NEUROLOGIC:  Motor and sensation is grossly normal.  Cranial nerves are grossly intact. PSYCH:  Alert and oriented to person, place and time. Affect is normal.  Assessment and Plan: This is a 57 y.o. male robotic assisted umbilical hernia repair.  - Discussed with the patient that the soreness will continue to improve as everything keeps healing.  Patient is otherwise recovering well from his surgery. - Reminded patient of activity restrictions. - He may wear his abdominal binder for 4 to 6 weeks postsurgery for comfort. - Follow-up as needed.  I spent 20 minutes dedicated to the care of this patient on the date of this encounter to include  pre-visit review of records, face-to-face time with the patient discussing diagnosis and management, and any post-visit coordination of care.   Howie Ill, MD [facility]

## 2023-08-14 DIAGNOSIS — G4733 Obstructive sleep apnea (adult) (pediatric): Secondary | ICD-10-CM | POA: Diagnosis not present

## 2023-08-27 DIAGNOSIS — R030 Elevated blood-pressure reading, without diagnosis of hypertension: Secondary | ICD-10-CM | POA: Diagnosis not present

## 2023-09-02 DIAGNOSIS — I10 Essential (primary) hypertension: Secondary | ICD-10-CM | POA: Diagnosis not present

## 2023-09-03 DIAGNOSIS — R030 Elevated blood-pressure reading, without diagnosis of hypertension: Secondary | ICD-10-CM | POA: Diagnosis not present

## 2023-09-14 DIAGNOSIS — G4733 Obstructive sleep apnea (adult) (pediatric): Secondary | ICD-10-CM | POA: Diagnosis not present

## 2023-10-14 DIAGNOSIS — G4733 Obstructive sleep apnea (adult) (pediatric): Secondary | ICD-10-CM | POA: Diagnosis not present

## 2023-11-13 DIAGNOSIS — G4733 Obstructive sleep apnea (adult) (pediatric): Secondary | ICD-10-CM | POA: Diagnosis not present

## 2023-11-25 DIAGNOSIS — I10 Essential (primary) hypertension: Secondary | ICD-10-CM | POA: Diagnosis not present

## 2023-12-02 DIAGNOSIS — I10 Essential (primary) hypertension: Secondary | ICD-10-CM | POA: Diagnosis not present

## 2023-12-14 DIAGNOSIS — G4733 Obstructive sleep apnea (adult) (pediatric): Secondary | ICD-10-CM | POA: Diagnosis not present

## 2023-12-23 LAB — LIPID PANEL
Cholesterol: 177 (ref 0–200)
HDL: 51 (ref 35–70)
LDL Cholesterol: 106
Triglycerides: 113 (ref 40–160)

## 2023-12-23 LAB — CBC AND DIFFERENTIAL
Hemoglobin: 17 (ref 13.5–17.5)
Platelets: 245 10*3/uL (ref 150–400)
WBC: 6.7

## 2023-12-23 LAB — TSH: TSH: 1.65 (ref 0.41–5.90)

## 2023-12-23 LAB — HEPATIC FUNCTION PANEL
ALT: 40 U/L (ref 10–40)
AST: 33 (ref 14–40)
Alkaline Phosphatase: 72 (ref 25–125)
Bilirubin, Total: 0.8

## 2023-12-23 LAB — BASIC METABOLIC PANEL WITH GFR
Creatinine: 1.1 (ref 0.6–1.3)
Glucose: 57
Potassium: 4.2 meq/L (ref 3.5–5.1)
Sodium: 139 (ref 137–147)

## 2023-12-23 LAB — IRON,TIBC AND FERRITIN PANEL: Iron: 98

## 2023-12-23 LAB — COMPREHENSIVE METABOLIC PANEL WITH GFR
Albumin: 4.6 (ref 3.5–5.0)
eGFR: 75

## 2023-12-23 LAB — VITAMIN D 25 HYDROXY (VIT D DEFICIENCY, FRACTURES): Vit D, 25-Hydroxy: 39.2

## 2023-12-23 LAB — CBC: RBC: 5.63 — AB (ref 3.87–5.11)

## 2023-12-23 LAB — VITAMIN B12: Vitamin B-12: 966

## 2023-12-29 ENCOUNTER — Telehealth: Payer: Self-pay

## 2023-12-29 LAB — HEMOGLOBIN A1C: Hemoglobin A1C: 6.2

## 2023-12-29 NOTE — Telephone Encounter (Signed)
 Copied from CRM [id number]. Topic: Clinical - Lab/Test Results >> Dec 29, 2023  8:43 AM Allyne Areola wrote: Reason for CRM: Patient is calling to verify if we received lab results for labs he had done at an outside facility last week. Best call back number [phone].

## 2023-12-29 NOTE — Telephone Encounter (Signed)
 Received faxed outside lab results.   Placed results in Dr Ocie Belt box.

## 2023-12-29 NOTE — Telephone Encounter (Signed)
 Spoke with pt notifying him we have not received outside lab results. States he will fax them and I provided office fax # [phone].

## 2023-12-30 ENCOUNTER — Encounter: Payer: Self-pay | Admitting: Family Medicine

## 2023-12-30 ENCOUNTER — Ambulatory Visit (INDEPENDENT_AMBULATORY_CARE_PROVIDER_SITE_OTHER): Payer: BC Managed Care – PPO | Admitting: Family Medicine

## 2023-12-30 VITALS — BP 130/80 | HR 83 | Temp 98.8°F | Ht 70.25 in | Wt 218.0 lb

## 2023-12-30 DIAGNOSIS — Z0000 Encounter for general adult medical examination without abnormal findings: Secondary | ICD-10-CM | POA: Diagnosis not present

## 2023-12-30 DIAGNOSIS — Z1211 Encounter for screening for malignant neoplasm of colon: Secondary | ICD-10-CM

## 2023-12-30 DIAGNOSIS — F419 Anxiety disorder, unspecified: Secondary | ICD-10-CM

## 2023-12-30 DIAGNOSIS — R011 Cardiac murmur, unspecified: Secondary | ICD-10-CM

## 2023-12-30 DIAGNOSIS — R7303 Prediabetes: Secondary | ICD-10-CM

## 2023-12-30 DIAGNOSIS — I10 Essential (primary) hypertension: Secondary | ICD-10-CM | POA: Diagnosis not present

## 2023-12-30 DIAGNOSIS — E785 Hyperlipidemia, unspecified: Secondary | ICD-10-CM

## 2023-12-30 DIAGNOSIS — Z6830 Body mass index (BMI) 30.0-30.9, adult: Secondary | ICD-10-CM

## 2023-12-30 DIAGNOSIS — G4733 Obstructive sleep apnea (adult) (pediatric): Secondary | ICD-10-CM

## 2023-12-30 DIAGNOSIS — E559 Vitamin D deficiency, unspecified: Secondary | ICD-10-CM

## 2023-12-30 LAB — PHOSPHORUS
LDH: 72
Phosphorus: 2.5
Prostate Specific Ag: 0.6
Thyroxine (T4): 7.7

## 2023-12-30 MED ORDER — LOSARTAN POTASSIUM 50 MG PO TABS: 50.0000 mg | ORAL_TABLET | Freq: Every day | ORAL | 4 refills | Status: AC

## 2023-12-30 MED ORDER — ATORVASTATIN CALCIUM 20 MG PO TABS: 20.0000 mg | ORAL_TABLET | Freq: Every day | ORAL | 4 refills | Status: AC

## 2023-12-30 NOTE — Assessment & Plan Note (Addendum)
 Preventative protocols reviewed and updated unless pt declined. Discussed healthy diet and lifestyle.  Refer for Cologuard.

## 2023-12-30 NOTE — Assessment & Plan Note (Signed)
 Not appreciated today.

## 2023-12-30 NOTE — Assessment & Plan Note (Addendum)
 Chronic, stable on losartan . Continue current regimen.

## 2023-12-30 NOTE — Assessment & Plan Note (Signed)
 5 lb weight loss noted.  Continue to encourage healthy diet and lifestyle choices to affect sustainable weight loss. Encourage incorporation regular walking /exercise into routine.

## 2023-12-30 NOTE — Assessment & Plan Note (Signed)
 Chronic, adequate on statin - continue atorvastatin  20mg  daily. The 10-year ASCVD risk score (Arnett DK, et al., 2019) is: 9.9%*   Values used to calculate the score:     Age: 58 years     Sex: Male     Is Non-Hispanic African American: No     Diabetic: No     Tobacco smoker: No     Systolic Blood Pressure: 130 mmHg     Is BP treated: Yes     HDL Cholesterol: 57 mg/dL*     Total Cholesterol: 251 mg/dL*     * - Cholesterol units were assumed for this score calculation

## 2023-12-30 NOTE — Patient Instructions (Addendum)
 Gusto verlo hoy  [place] ejercicio en la rutina.  Regresar en 1 ao para proximo fisico

## 2023-12-30 NOTE — Assessment & Plan Note (Signed)
 Chronic, continues CPAP and tolerating well. This is followed by Dr Meredeth Stallion in [place]

## 2023-12-30 NOTE — Assessment & Plan Note (Signed)
 Significant improvement noted  Enjoying being a new grandfather.

## 2023-12-30 NOTE — Progress Notes (Signed)
 Ph: [phone] Fax: [phone]   Patient ID: Jesse Phillips, male    DOB: 1966/05/09, 58 y.o.   MRN: [id number]  This visit was conducted in person.  BP 130/80   Pulse 83   Temp 98.8 F (37.1 C) (Oral)   Ht 5' 10.25" (1.784 m)   Wt 218 lb (98.9 kg)   SpO2 98%   BMI 31.06 kg/m    CC: CPE Subjective:   HPI: [name] is a 58 y.o. male presenting on 12/30/2023 for Annual Exam (CPE: no concerns; no recent colonscopy)   Labs through work Q6 mo.   Recovered well from umbilical hernia repair 06/2023 ([place])  OSA on CPAP followed by Dr Meredeth Stallion. He continues using CPAP with good compliance.   New grandfather - really enjoying this.  Notes improvement in stressors.  Taking more vacation times.  Planning trip to [place]  Preventative:  Colon cancer screening - iFOB negative 12/2022, will order Cologuard.  Prostate cancer screening - brother with prostate cancer age 60.  Lung cancer screening - not eligible  Flu shot yearly at work COVID vaccine - Moderna 12/2019, 01/2020, booster 09/2020 Penumococcal - declines  Tdap 10/2014  Completed Hep B series Shingrix  - 11/2021, 05/2022  Seat belt use discussed  Sunscreen use discussed. No changing mole on skin.  Non smoker  Alcohol - rare beer  Dentist yearly  Eye exam yearly    Lives with Billye Buerger wife and 1 son  Occupation: Location manager 5am to 5pm  From [place]  Edu: HS  Activity: active at work, no regular gym or walking outside of work Diet: wife cooks healthy, good water      Relevant past medical, surgical, family and social history reviewed and updated as indicated. Interim medical history since our last visit reviewed. Allergies and medications reviewed and updated. Outpatient Medications Prior to Visit  Medication Sig Dispense Refill   Ascorbic Acid (VITAMIN C ) 500 MG CAPS Take 1 capsule by mouth daily.     Cholecalciferol (VITAMIN D ) 2000 UNITS CAPS Take 2,000 Units by mouth daily.     Multiple  Vitamins-Minerals (CENTRUM ADULTS PO) Take 1 tablet by mouth daily.     OXYGEN Inhale into the lungs. Uses concentrator on Cpap at night     atorvastatin  (LIPITOR) 20 MG tablet Take 1 tablet (20 mg total) by mouth daily. (Patient taking differently: Take 20 mg by mouth every morning.) 90 tablet 4   losartan  (COZAAR ) 50 MG tablet Take 1 tablet (50 mg total) by mouth daily. (Patient taking differently: Take 50 mg by mouth every morning.) 90 tablet 4   acetaminophen  (TYLENOL ) 500 MG tablet Take 2 tablets (1,000 mg total) by mouth every 6 (six) hours as needed for mild pain (pain score 1-3). (Patient not taking: Reported on 12/30/2023)     ibuprofen  (ADVIL ) 600 MG tablet Take 1 tablet (600 mg total) by mouth every 8 (eight) hours as needed for moderate pain (pain score 4-6). (Patient not taking: Reported on 12/30/2023) 60 tablet 1   triamcinolone  cream (KENALOG ) 0.1 % Apply 1 application. topically 2 (two) times daily. (Patient not taking: Reported on 12/30/2023) 80 g 0   No facility-administered medications prior to visit.     Per HPI unless specifically indicated in ROS section below Review of Systems  Constitutional:  Negative for activity change, appetite change, chills, fatigue, fever and unexpected weight change.  HENT:  Negative for hearing loss.   Eyes:  Negative for visual disturbance.  Respiratory:  Negative for cough, chest tightness, shortness of breath and wheezing.   Cardiovascular:  Negative for chest pain, palpitations and leg swelling.  Gastrointestinal:  Negative for abdominal distention, abdominal pain, blood in stool, constipation, diarrhea, nausea and vomiting.  Genitourinary:  Negative for difficulty urinating and hematuria.  Musculoskeletal:  Negative for arthralgias, myalgias and neck pain.  Skin:  Negative for rash.  Neurological:  Negative for dizziness, seizures, syncope and headaches.  Hematological:  Negative for adenopathy. Does not bruise/bleed easily.   Psychiatric/Behavioral:  Negative for dysphoric mood. The patient is not nervous/anxious.     Objective:  BP 130/80   Pulse 83   Temp 98.8 F (37.1 C) (Oral)   Ht 5' 10.25" (1.784 m)   Wt 218 lb (98.9 kg)   SpO2 98%   BMI 31.06 kg/m   Wt Readings from Last 3 Encounters:  12/30/23 218 lb (98.9 kg)  07/27/23 221 lb 9.6 oz (100.5 kg)  07/14/23 222 lb 0.1 oz (100.7 kg)      Physical Exam Vitals and nursing note reviewed.  Constitutional:      General: He is not in acute distress.    Appearance: Normal appearance. He is well-developed. He is not ill-appearing.  HENT:     Head: Normocephalic and atraumatic.     Right Ear: Hearing, tympanic membrane, ear canal and external ear normal.     Left Ear: Hearing, tympanic membrane, ear canal and external ear normal.     Mouth/Throat:     Mouth: Mucous membranes are moist.     Pharynx: Oropharynx is clear. No oropharyngeal exudate or posterior oropharyngeal erythema.  Eyes:     General: No scleral icterus.    Extraocular Movements: Extraocular movements intact.     Conjunctiva/sclera: Conjunctivae normal.     Pupils: Pupils are equal, round, and reactive to light.  Neck:     Thyroid: No thyroid mass or thyromegaly.  Cardiovascular:     Rate and Rhythm: Normal rate and regular rhythm.     Pulses: Normal pulses.          Radial pulses are 2+ on the right side and 2+ on the left side.     Heart sounds: Normal heart sounds. No murmur heard. Pulmonary:     Effort: Pulmonary effort is normal. No respiratory distress.     Breath sounds: Normal breath sounds. No wheezing, rhonchi or rales.  Abdominal:     General: Bowel sounds are normal. There is no distension.     Palpations: Abdomen is soft. There is no mass.     Tenderness: There is no abdominal tenderness. There is no guarding or rebound.     Hernia: No hernia is present.  Musculoskeletal:        General: Normal range of motion.     Cervical back: Normal range of motion and neck  supple.     Right lower leg: No edema.     Left lower leg: No edema.  Lymphadenopathy:     Cervical: No cervical adenopathy.  Skin:    General: Skin is warm and dry.     Findings: No rash.  Neurological:     General: No focal deficit present.     Mental Status: He is alert and oriented to person, place, and time.  Psychiatric:        Mood and Affect: Mood normal.        Behavior: Behavior normal.        Thought Content: Thought  content normal.        Judgment: Judgment normal.       Results for orders placed or performed during the hospital encounter of 07/07/23  CBC   Collection Time: 07/07/23  8:09 AM  Result Value Ref Range   WBC 6.8 4.0 - 10.5 K/uL   RBC 5.65 4.22 - 5.81 MIL/uL   Hemoglobin 16.7 13.0 - 17.0 g/dL   HCT 30.8 65.7 - 84.6 %   MCV 87.8 80.0 - 100.0 fL   MCH 29.6 26.0 - 34.0 pg   MCHC 33.7 30.0 - 36.0 g/dL   RDW 96.2 95.2 - 84.1 %   Platelets 246 150 - 400 K/uL   nRBC 0.0 0.0 - 0.2 %  Basic metabolic panel   Collection Time: 07/07/23  8:09 AM  Result Value Ref Range   Sodium 136 135 - 145 mmol/L   Potassium 4.1 3.5 - 5.1 mmol/L   Chloride 102 98 - 111 mmol/L   CO2 27 22 - 32 mmol/L   Glucose, Bld 106 (H) 70 - 99 mg/dL   BUN 18 6 - 20 mg/dL   Creatinine, Ser 3.24 0.61 - 1.24 mg/dL   Calcium  9.1 8.9 - 10.3 mg/dL   GFR, Estimated >40 >10 mL/min   Anion gap 7 5 - 15    Assessment & Plan:   Problem List Items Addressed This Visit     Health maintenance examination - Primary (Chronic)   Preventative protocols reviewed and updated unless pt declined. Discussed healthy diet and lifestyle.  Refer for Cologuard.       HLD (hyperlipidemia)   Chronic, adequate on statin - continue atorvastatin  20mg  daily. The 10-year ASCVD risk score (Arnett DK, et al., 2019) is: 9.9%*   Values used to calculate the score:     Age: 54 years     Sex: Male     Is Non-Hispanic African American: No     Diabetic: No     Tobacco smoker: No     Systolic Blood Pressure:  130 mmHg     Is BP treated: Yes     HDL Cholesterol: 57 mg/dL*     Total Cholesterol: 251 mg/dL*     * - Cholesterol units were assumed for this score calculation       Relevant Medications   atorvastatin  (LIPITOR) 20 MG tablet   losartan  (COZAAR ) 50 MG tablet   OSA on CPAP   Chronic, continues CPAP and tolerating well. This is followed by Dr Meredeth Stallion in [facility]      BMI 30.0-30.9,adult   5 lb weight loss noted.  Continue to encourage healthy diet and lifestyle choices to affect sustainable weight loss. Encourage incorporation regular walking /exercise into routine.       Prediabetes   Encouraged limiting added sugar in diet.       Vitamin D  deficiency   Continue vit D 2000 units daily - levels now normal range.       Cardiac murmur   Not appreciated today       White coat syndrome with hypertension   Chronic, stable on losartan . Continue current regimen.       Relevant Medications   atorvastatin  (LIPITOR) 20 MG tablet   losartan  (COZAAR ) 50 MG tablet   Anxiety   Significant improvement noted  Enjoying being a new grandfather.       Other Visit Diagnoses       Special screening for malignant neoplasms, colon  Relevant Orders   Cologuard        Meds ordered this encounter  Medications   atorvastatin  (LIPITOR) 20 MG tablet    Sig: Take 1 tablet (20 mg total) by mouth daily.    Dispense:  90 tablet    Refill:  4   losartan  (COZAAR ) 50 MG tablet    Sig: Take 1 tablet (50 mg total) by mouth daily.    Dispense:  90 tablet    Refill:  4    Orders Placed This Encounter  Procedures   Cologuard    Spanish speaking    Patient Instructions  Gusto verlo hoy  Suba ejercicio en la rutina.  Regresar en 1 ao para proximo fisico  Follow up plan: Return in about 1 year (around 12/29/2024) for annual exam, prior fasting for blood work.  Claire Crick, MD

## 2023-12-30 NOTE — Assessment & Plan Note (Signed)
 Continue vit D 2000 units daily - levels now normal range.

## 2023-12-30 NOTE — Assessment & Plan Note (Signed)
Encouraged limiting added sugar in diet.  

## 2023-12-31 DIAGNOSIS — R030 Elevated blood-pressure reading, without diagnosis of hypertension: Secondary | ICD-10-CM | POA: Diagnosis not present

## 2024-01-04 ENCOUNTER — Ambulatory Visit: Payer: BC Managed Care – PPO | Admitting: Nurse Practitioner

## 2024-01-13 DIAGNOSIS — G4733 Obstructive sleep apnea (adult) (pediatric): Secondary | ICD-10-CM | POA: Diagnosis not present

## 2024-01-25 DIAGNOSIS — Z1211 Encounter for screening for malignant neoplasm of colon: Secondary | ICD-10-CM | POA: Diagnosis not present

## 2024-01-29 LAB — COLOGUARD: COLOGUARD: NEGATIVE

## 2024-02-01 ENCOUNTER — Ambulatory Visit: Payer: Self-pay | Admitting: Family Medicine

## 2024-02-03 DIAGNOSIS — I10 Essential (primary) hypertension: Secondary | ICD-10-CM | POA: Diagnosis not present

## 2024-02-04 ENCOUNTER — Ambulatory Visit (INDEPENDENT_AMBULATORY_CARE_PROVIDER_SITE_OTHER): Admitting: Nurse Practitioner

## 2024-02-04 ENCOUNTER — Encounter: Payer: Self-pay | Admitting: Nurse Practitioner

## 2024-02-04 VITALS — BP 146/98 | HR 70 | Temp 98.3°F | Resp 16 | Ht 70.25 in | Wt 222.8 lb

## 2024-02-04 DIAGNOSIS — I10 Essential (primary) hypertension: Secondary | ICD-10-CM | POA: Diagnosis not present

## 2024-02-04 DIAGNOSIS — Z7189 Other specified counseling: Secondary | ICD-10-CM

## 2024-02-04 DIAGNOSIS — G4733 Obstructive sleep apnea (adult) (pediatric): Secondary | ICD-10-CM | POA: Diagnosis not present

## 2024-02-04 NOTE — Progress Notes (Signed)
 [facility] [address] [place], [place] [address]  Internal MEDICINE  Office Visit Note  Patient Name: Jesse Phillips  [id number]  [id number]  Date of Service: 02/04/2024  Chief Complaint  Patient presents with   Sleep Apnea    Annual follow up  Cpap download    HPI Jesse Phillips presents for a follow-up visit for OSA on CPAP OSA on CPAP -- patient recently went to [place]  and was using his old CPAP machine while travelling so his compliance score is low on the report but he actually uses a CPAP machien every night. He returned home at the end of may and his compliance since returnign home is 100% Sleeps with it for at least 7-10 hours at night. Denies issues with sinus. Reports cough has gotten worse. No headaches. Wakes up well-rested, denies any excessive daytime sleepiness. No change in epworth sleepiness scale score.    CPAP Download Set pressure 10 cmH2O 95th percentile leak 15.1 L/min Apnea index 0.1 events/hr Hypopnea index 0.1 events/hr AHI 0.2 events/hr Average usage per night 7 hours 27 minutes Usage >= 4 hours 12 days  Usage <4 hours 0 days  Total compliance 12 out of 30 days (40%) -- patient was out of the country and took his older CPAP machine with him to [place] and used his old machine every night while he was in [place]. His compliance is 100% but his old machine does not transmit data to [employer].   EPWORTH SLEEPINESS SCALE: Scale: (0)= no chance of dozing; (1)= slight chance of dozing; (2)= moderate chance of dozing; (3)= high chance of dozing Chance  Situation   Sitting and reading: 0 Watching TV: 1 Sitting Inactive in public: 1 As a passenger in car: 0   Lying down to rest: 0 Sitting and talking: 0 Sitting quielty after lunch: 0 In a car, stopped in traffic: 0 TOTAL SCORE:   2 out of 24      Current Medication: Outpatient Encounter Medications as of 02/04/2024  Medication Sig   acetaminophen  (TYLENOL ) 500 MG tablet Take 2 tablets  (1,000 mg total) by mouth every 6 (six) hours as needed for mild pain (pain score 1-3).   Ascorbic Acid (VITAMIN C ) 500 MG CAPS Take 1 capsule by mouth daily.   atorvastatin  (LIPITOR) 20 MG tablet Take 1 tablet (20 mg total) by mouth daily.   Cholecalciferol (VITAMIN D ) 2000 UNITS CAPS Take 2,000 Units by mouth daily.   ibuprofen  (ADVIL ) 600 MG tablet Take 1 tablet (600 mg total) by mouth every 8 (eight) hours as needed for moderate pain (pain score 4-6).   losartan  (COZAAR ) 50 MG tablet Take 1 tablet (50 mg total) by mouth daily.   Multiple Vitamins-Minerals (CENTRUM ADULTS PO) Take 1 tablet by mouth daily.   OXYGEN Inhale into the lungs. Uses concentrator on Cpap at night   triamcinolone  cream (KENALOG ) 0.1 % Apply 1 application. topically 2 (two) times daily.   No facility-administered encounter medications on file as of 02/04/2024.    Surgical History: Past Surgical History:  Procedure Laterality Date   LAPAROSCOPIC INCISIONAL / UMBILICAL / VENTRAL HERNIA REPAIR N/A 07/14/2023   umbilical hernia, 2 cm ([place])    Medical History: Past Medical History:  Diagnosis Date   Anxiety    DDD (degenerative disc disease), cervical    GERD (gastroesophageal reflux disease)    Headache    Heart murmur    HLD (hyperlipidemia)    HTN (hypertension)    OSA on  CPAP    compliant   Pre-diabetes    Umbilical hernia    Vitamin D  deficiency     Family History: Family History  Problem Relation Age of Onset   Hypertension Father    Hyperlipidemia Father    Cancer Brother 81       prostate   Depression Brother        deceased (homosexual)   Deep vein thrombosis Brother    Diabetes Neg Hx    CAD Neg Hx    Stroke Neg Hx     Social History   Socioeconomic History   Marital status: Married    Spouse name: Not on file   Number of children: Not on file   Years of education: Not on file   Highest education level: Not on file  Occupational History   Not on file  Tobacco Use    Smoking status: Never    Passive exposure: Never   Smokeless tobacco: Never  Vaping Use   Vaping status: Never Used  Substance and Sexual Activity   Alcohol use: Yes    Alcohol/week: 1.0 standard drink of alcohol    Types: 1 Cans of beer per week    Comment: 1 beer every day and 8 beers a day on weekend   Drug use: No   Sexual activity: Not on file  Other Topics Concern   Not on file  Social History Narrative   Lives with Billye Buerger wife and 1 son   Occupation: Location manager   From [place]   Edu: HS   Activity: occasionally gym   Diet: likes but avoids sweets and carbs, wife cooks healthy    O+ blood type   Social Drivers of Corporate investment banker Strain: Not on file  Food Insecurity: Not on file  Transportation Needs: Not on file  Physical Activity: Not on file  Stress: Not on file  Social Connections: Not on file  Intimate Partner Violence: Not on file      Review of Systems  Constitutional:  Negative for chills, fatigue and unexpected weight change.  HENT:  Negative for congestion, postnasal drip, rhinorrhea, sneezing and sore throat.   Respiratory: Negative.  Negative for cough, chest tightness, shortness of breath and wheezing.   Cardiovascular: Negative.  Negative for chest pain and palpitations.  Gastrointestinal:  Negative for abdominal pain, constipation, diarrhea, nausea and vomiting.  Musculoskeletal:  Negative for arthralgias, back pain, joint swelling and neck pain.  Skin:  Negative for rash.  Neurological: Negative.  Negative for tremors.  Psychiatric/Behavioral:  Negative for behavioral problems (Depression), sleep disturbance and suicidal ideas. The patient is not nervous/anxious.     Vital Signs: BP (!) 146/98   Pulse 70   Temp 98.3 F (36.8 C)   Resp 16   Ht 5' 10.25 (1.784 m)   Wt 222 lb 12.8 oz (101.1 kg)   SpO2 96%   BMI 31.74 kg/m    Physical Exam Vitals reviewed.  Constitutional:      General: He is not in acute distress.     Appearance: Normal appearance. He is not ill-appearing.  HENT:     Head: Normocephalic and atraumatic.   Eyes:     Pupils: Pupils are equal, round, and reactive to light.    Cardiovascular:     Rate and Rhythm: Normal rate and regular rhythm.  Pulmonary:     Effort: Pulmonary effort is normal. No respiratory distress.   Neurological:     Mental Status:  He is alert.        Assessment/Plan: 1. OSA on CPAP (Primary) Continue CPAP use as instructed. Continue to use old CPAP machine when traveling out of town. Gets supplies from DME company, no issues. Current CPAP machine is 75-32 years old. Will qualify for a new machine in a couple of years.   2. White coat syndrome with hypertension BP elevated, this is being monitored by his PCP  3. CPAP use counseling Continue CPAP use and maintenance as instructed. Will order a new machine in a couple of years when he is eligible.   4. Obesity, morbid (HCC) Patient's weight is stable has not gained anymore weight since his visit last year. Patient is aware that some weight loss will improve his sleep apnea.    General Counseling: Jesse Phillips verbalizes understanding of the findings of todays visit and agrees with plan of treatment. I have discussed any further diagnostic evaluation that may be needed or ordered today. We also reviewed his medications today. he has been encouraged to call the office with any questions or concerns that should arise related to todays visit.    No orders of the defined types were placed in this encounter.   No orders of the defined types were placed in this encounter.   Return in about 1 year (around 02/03/2025) for F/U, pulmonary/sleep, Coralee Edberg or DSK.   Total time spent:30 Minutes Time spent includes review of chart, medications, test results, and follow up plan with the patient.   [place] Controlled Substance Database was reviewed by me.  This patient was seen by Laurence Pons, FNP-C in collaboration with  Dr. Verneta Gone as a part of collaborative care agreement.   Jossilyn Benda R. Bobbi Burow, MSN, FNP-C Internal medicine

## 2024-02-11 DIAGNOSIS — R030 Elevated blood-pressure reading, without diagnosis of hypertension: Secondary | ICD-10-CM | POA: Diagnosis not present

## 2024-02-25 DIAGNOSIS — R030 Elevated blood-pressure reading, without diagnosis of hypertension: Secondary | ICD-10-CM | POA: Diagnosis not present

## 2024-03-10 DIAGNOSIS — R030 Elevated blood-pressure reading, without diagnosis of hypertension: Secondary | ICD-10-CM | POA: Diagnosis not present

## 2024-03-30 DIAGNOSIS — I10 Essential (primary) hypertension: Secondary | ICD-10-CM | POA: Diagnosis not present

## 2024-03-31 DIAGNOSIS — R030 Elevated blood-pressure reading, without diagnosis of hypertension: Secondary | ICD-10-CM | POA: Diagnosis not present

## 2024-04-21 DIAGNOSIS — R030 Elevated blood-pressure reading, without diagnosis of hypertension: Secondary | ICD-10-CM | POA: Diagnosis not present

## 2024-05-05 DIAGNOSIS — R030 Elevated blood-pressure reading, without diagnosis of hypertension: Secondary | ICD-10-CM | POA: Diagnosis not present

## 2024-05-19 DIAGNOSIS — R030 Elevated blood-pressure reading, without diagnosis of hypertension: Secondary | ICD-10-CM | POA: Diagnosis not present

## 2024-05-31 NOTE — Progress Notes (Signed)
 Jesse Phillips                                          MRN: [id number]   05/31/2024   The [facility] Quality Team Specialist reviewed this patient medical record for the purposes of chart review for care gap closure. The following were reviewed: chart review for care gap closure-controlling blood pressure.    [facility] Quality Team

## 2025-01-02 ENCOUNTER — Encounter: Admitting: Family Medicine

## 2025-02-02 ENCOUNTER — Ambulatory Visit: Admitting: Nurse Practitioner
